# Patient Record
Sex: Male | Born: 1985
Health system: Southern US, Community
[De-identification: ages and names within clinical notes are randomized; demographics above are authoritative.]

## PROBLEM LIST (undated history)

## (undated) DIAGNOSIS — I471 Supraventricular tachycardia, unspecified: Secondary | ICD-10-CM

## (undated) DIAGNOSIS — K589 Irritable bowel syndrome without diarrhea: Secondary | ICD-10-CM

## (undated) HISTORY — PX: HYDROCELE EXCISION / REPAIR: SUR1145

---
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
## 2007-10-30 ENCOUNTER — Encounter: Admission: RE | Admit: 2007-10-30 | Discharge: 2007-10-30 | Payer: Self-pay | Admitting: Internal Medicine

## 2009-07-08 ENCOUNTER — Ambulatory Visit (HOSPITAL_COMMUNITY): Admission: RE | Admit: 2009-07-08 | Discharge: 2009-07-08 | Payer: Self-pay | Admitting: Internal Medicine

## 2012-05-15 ENCOUNTER — Ambulatory Visit (INDEPENDENT_AMBULATORY_CARE_PROVIDER_SITE_OTHER): Payer: BC Managed Care – PPO | Admitting: Family Medicine

## 2012-05-15 VITALS — BP 114/70 | HR 76 | Temp 97.8°F | Resp 16 | Ht 70.0 in | Wt 134.0 lb

## 2012-05-15 DIAGNOSIS — T63461A Toxic effect of venom of wasps, accidental (unintentional), initial encounter: Secondary | ICD-10-CM

## 2012-05-15 DIAGNOSIS — T6391XA Toxic effect of contact with unspecified venomous animal, accidental (unintentional), initial encounter: Secondary | ICD-10-CM

## 2012-05-15 MED ORDER — METHYLPREDNISOLONE ACETATE 80 MG/ML IJ SUSP
80.0000 mg | Freq: Once | INTRAMUSCULAR | Status: AC
  Administered 2012-05-15: 80 mg via INTRAMUSCULAR

## 2012-05-15 NOTE — Progress Notes (Signed)
Subjective: 26 year old male with history of getting several yellow jacket stings on his left arm when watering his mother garden. He has a burning discomfort and redness in his left arm into large patches between the elbow and shoulder.  Objective: Large splotches of erythema as noted above. One of the yellow jacket bites is visible.  Plan: Yellow jacket sting--this just appears to be a bad local reaction and not a true systemic allergy.  Plan: Depo-Medrol 80 Zyrtec

## 2012-05-15 NOTE — Patient Instructions (Addendum)
Zyrtec  Bee, Wasp, or Hornet Sting Your caregiver has diagnosed you as having an insect sting. An insect sting appears as a red lump in the skin that sometimes has a tiny hole in the center, or it may have a stinger in the center of the wound. The most common stings are from wasps, hornets and bees. Individuals have different reactions to insect stings.  A normal reaction may cause pain, swelling, and redness around the sting site.   A localized allergic reaction may cause swelling and redness that extends beyond the sting site.   A large local reaction may continue to develop over the next 12 to 36 hours.   On occasion, the reactions can be severe (anaphylactic reaction). An anaphylactic reaction may cause wheezing; difficulty breathing; chest pain; fainting; raised, itchy, red patches on the skin; a sick feeling to your stomach (nausea); vomiting; cramping; or diarrhea. If you have had an anaphylactic reaction to an insect sting in the past, you are more likely to have one again.  HOME CARE INSTRUCTIONS   With bee stings, a small sac of poison is left in the wound. Brushing across this with something such as a credit card, or anything similar, will help remove this and decrease the amount of the reaction. This same procedure will not help a wasp sting as they do not leave behind a stinger and poison sac.   Apply a cold compress for 10 to 20 minutes every hour for 1 to 2 days, depending on severity, to reduce swelling and itching.   To lessen pain, a paste made of water and baking soda may be rubbed on the bite or sting and left on for 5 minutes.   To relieve itching and swelling, you may use take medication or apply medicated creams or lotions as directed.   Only take over-the-counter or prescription medicines for pain, discomfort, or fever as directed by your caregiver.   Wash the sting site daily with soap and water. Apply antibiotic ointment on the sting site as directed.   If you  suffered a severe reaction:   If you did not require hospitalization, an adult will need to stay with you for 24 hours in case the symptoms return.   You may need to wear a medical bracelet or necklace stating the allergy.   You and your family need to learn when and how to use an anaphylaxis kit or epinephrine injection.   If you have had a severe reaction before, always carry your anaphylaxis kit with you.  SEEK MEDICAL CARE IF:   None of the above helps within 2 to 3 days.   The area becomes red, warm, tender, and swollen beyond the area of the bite or sting.   You have an oral temperature above 102 F (38.9 C).  SEEK IMMEDIATE MEDICAL CARE IF:  You have symptoms of an allergic reaction which are:  Wheezing.   Difficulty breathing.   Chest pain.   Lightheadedness or fainting.   Itchy, raised, red patches on the skin.   Nausea, vomiting, cramping or diarrhea.  ANY OF THESE SYMPTOMS MAY REPRESENT A SERIOUS PROBLEM THAT IS AN EMERGENCY. Do not wait to see if the symptoms will go away. Get medical help right away. Call your local emergency services (911 in [place] DO NOT drive yourself to the hospital. MAKE SURE YOU:   Understand these instructions.   Will watch your condition.   Will get help right away if you are not doing  well or get worse.  Document Released: 08/06/2005 Document Revised: 07/26/2011 Document Reviewed: 01/21/2010 [facility] Patient Information 2012 [place], [place].

## 2013-05-05 ENCOUNTER — Emergency Department (HOSPITAL_COMMUNITY)
Admission: EM | Admit: 2013-05-05 | Discharge: 2013-05-05 | Disposition: A | Discharge status: Home / Self Care | Payer: BC Managed Care – PPO | Attending: Emergency Medicine | Admitting: Emergency Medicine

## 2013-05-05 ENCOUNTER — Emergency Department (HOSPITAL_COMMUNITY): Payer: BC Managed Care – PPO

## 2013-05-05 ENCOUNTER — Encounter (HOSPITAL_COMMUNITY): Payer: Self-pay | Admitting: Family Medicine

## 2013-05-05 DIAGNOSIS — R0789 Other chest pain: Secondary | ICD-10-CM | POA: Insufficient documentation

## 2013-05-05 DIAGNOSIS — R0902 Hypoxemia: Secondary | ICD-10-CM | POA: Insufficient documentation

## 2013-05-05 DIAGNOSIS — R000 Tachycardia, unspecified: Secondary | ICD-10-CM | POA: Insufficient documentation

## 2013-05-05 DIAGNOSIS — R0602 Shortness of breath: Secondary | ICD-10-CM

## 2013-05-05 DIAGNOSIS — R079 Chest pain, unspecified: Secondary | ICD-10-CM | POA: Insufficient documentation

## 2013-05-05 DIAGNOSIS — R030 Elevated blood-pressure reading, without diagnosis of hypertension: Secondary | ICD-10-CM | POA: Insufficient documentation

## 2013-05-05 LAB — POCT I-STAT, CHEM 8
BUN: 5 mg/dL — ABNORMAL LOW (ref 6–23)
Chloride: 106 mEq/L (ref 96–112)
Creatinine, Ser: 1.3 mg/dL (ref 0.50–1.35)
Potassium: 3.7 mEq/L (ref 3.5–5.1)
Sodium: 147 mEq/L — ABNORMAL HIGH (ref 135–145)

## 2013-05-05 LAB — D-DIMER, QUANTITATIVE: D-Dimer, Quant: <0.27 ug/mL-FEU (ref 0.00–0.48)

## 2013-05-05 NOTE — ED Notes (Signed)
Pt able to ambulate without any difficulties or complaints of SOB.

## 2013-05-05 NOTE — ED Provider Notes (Signed)
CSN: [id number]     Arrival date & time 05/05/13  0110 History   First MD Initiated Contact with Patient 05/05/13 [phone]     Chief Complaint  Patient presents with  . Shortness of Breath   (Consider location/radiation/quality/duration/timing/severity/associated sxs/prior Treatment) HPI History provided by pt.   Pt laid down to go to bed at 11:45 and had acute onset SOB and chest tightness.  Went to [employer] where his vital signs were checked and he was found to be hypertensive, tachycardic and hypoxic w/ O2 sat in upper 80s-92.  Dyspnea resolved spontaneously in 2-2.5hrs.  No recent fever, cough, nasal congestion, rhinorrhea, abd pain, N/V/D, LE edema/pain.  No recent trauma. No h/o cardiac or pulmonary dz.  No recent trauma.  Does not smoke cigarettes.  No RF for PE. History reviewed. No pertinent past medical history. Past Surgical History  Procedure Laterality Date  . Hydrocele excision / repair     No family history on file. History  Substance Use Topics  . Smoking status: Never Smoker   . Smokeless tobacco: Not on file  . Alcohol Use: Yes     Comment: Socially    Review of Systems  All other systems reviewed and are negative.    Allergies  Review of patient's allergies indicates no known allergies.  Home Medications   Current Outpatient Rx  Name  Route  Sig  Dispense  Refill  . clidinium-chlordiazePOXIDE (LIBRAX) 2.5-5 MG per capsule   Oral   Take 1 capsule by mouth 3 (three) times daily as needed.          BP 130/86  Pulse 96  Temp(Src) 98.5 F (36.9 C) (Oral)  Resp 16  Wt 130 lb (58.968 kg)  BMI 18.65 kg/m2  SpO2 99% Physical Exam  Nursing note and vitals reviewed. Constitutional: He is oriented to person, place, and time. He appears well-developed and well-nourished. No distress.  HENT:  Head: Normocephalic and atraumatic.  Eyes:  Normal appearance  Neck: Normal range of motion.  Cardiovascular: Normal rate, regular rhythm and intact distal pulses.    Pulmonary/Chest: Effort normal and breath sounds normal. No respiratory distress.  No pleuritic pain reported  Abdominal: Soft. Bowel sounds are normal. He exhibits no distension. There is no tenderness. There is no guarding.  Musculoskeletal: Normal range of motion.  No peripheral edema or calf tenderness  Neurological: He is alert and oriented to person, place, and time.  Skin: Skin is warm and dry. No rash noted.  Psychiatric: He has a normal mood and affect. His behavior is normal.    ED Course  Procedures (including critical care time)  Date: 05/05/2013  Rate: 107  Rhythm: sinus tachycardia  QRS Axis: normal  Intervals: normal  ST/T Wave abnormalities: normal  Conduction Disutrbances:none  Narrative Interpretation:   Old EKG Reviewed: none available   Labs Review Labs Reviewed  POCT I-STAT, CHEM 8 - Abnormal; Notable for the following:    Sodium 147 (*)    BUN 5 (*)    All other components within normal limits  D-DIMER, QUANTITATIVE   Imaging Review Dg Chest 2 View  05/05/2013   *RADIOLOGY REPORT*  Clinical Data: Shortness of breath and hypoxia  CHEST - 2 VIEW  Comparison:  None available.  Findings: Cardiac and mediastinal silhouettes are within normal limits.  Lungs are well inflated.  No airspace consolidation, pleural effusion, or pulmonary edema is identified.  There is no pneumothorax.  No acute osseous abnormality identified.  IMPRESSION: No  acute cardiopulmonary process.   Original Report Authenticated By: Rise Mu, M.D.    MDM   1. 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  Resolved spontaneously w/in 2 hours and now feels well.  No trauma.  No h/o cardiac/pulmonary dz.  No RF for PE.  On exam, afebrile, elevated HR in 90s, no respiratory distress, nml breath sounds, no LE edema/ttp.  EKG and CXR unremarkable and d-dimer neg.  Results discussed w/ pt.  Nursing  staff ambulated and he did not drop his O2 sat nor become symptomatic.  Suspect he may have had an anxiety attack. Return precautions discussed.     Otilio Miu, PA-C 05/05/13 [phone]

## 2013-05-05 NOTE — ED Notes (Signed)
Patient states that he went to work and then went out after work. States that when he got into bed he has been unable to catch his breath and has been feeling short of breath. 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

## 2013-05-06 NOTE — ED Provider Notes (Signed)
Medical screening examination/treatment/procedure(s) were performed by non-physician practitioner and as supervising physician I was immediately available for consultation/collaboration.  Aftan Vint R. Thinh Cuccaro, MD 05/06/13 2025 

## 2019-08-11 MED FILL — VIBERZI 100 MG TABS: 100 | 30 days supply | Qty: 30 | Fill #0

## 2019-09-10 DIAGNOSIS — M25311 Other instability, right shoulder: Secondary | ICD-10-CM | POA: Diagnosis not present

## 2019-09-10 DIAGNOSIS — M25511 Pain in right shoulder: Secondary | ICD-10-CM | POA: Diagnosis not present

## 2019-09-10 MED FILL — VIBERZI 100 MG TABS: 100 | 90 days supply | Qty: 90 | Fill #0

## 2019-10-15 ENCOUNTER — Ambulatory Visit: Payer: 59 | Attending: Internal Medicine

## 2019-10-15 DIAGNOSIS — Z20822 Contact with and (suspected) exposure to covid-19: Secondary | ICD-10-CM | POA: Insufficient documentation

## 2019-10-16 LAB — NOVEL CORONAVIRUS, NAA: SARS-CoV-2, NAA: NOT DETECTED

## 2019-12-08 MED FILL — VIBERZI 100 MG TABS: 100 | 90 days supply | Qty: 90 | Fill #1

## 2020-03-07 MED FILL — VIBERZI 100 MG TABS: 100 | 90 days supply | Qty: 90 | Fill #0

## 2020-04-13 ENCOUNTER — Emergency Department (HOSPITAL_COMMUNITY)
Admission: EM | Admit: 2020-04-13 | Discharge: 2020-04-13 | Disposition: A | Discharge status: Home / Self Care | Payer: 59 | Attending: Emergency Medicine | Admitting: Emergency Medicine

## 2020-04-13 ENCOUNTER — Emergency Department (HOSPITAL_COMMUNITY): Payer: 59

## 2020-04-13 ENCOUNTER — Encounter (HOSPITAL_COMMUNITY): Payer: Self-pay | Admitting: Emergency Medicine

## 2020-04-13 DIAGNOSIS — R200 Anesthesia of skin: Secondary | ICD-10-CM | POA: Diagnosis present

## 2020-04-13 DIAGNOSIS — G43909 Migraine, unspecified, not intractable, without status migrainosus: Secondary | ICD-10-CM | POA: Diagnosis not present

## 2020-04-13 DIAGNOSIS — G43109 Migraine with aura, not intractable, without status migrainosus: Secondary | ICD-10-CM

## 2020-04-13 LAB — COMPREHENSIVE METABOLIC PANEL
ALT: 29 U/L (ref 0–44)
AST: 23 U/L (ref 15–41)
Albumin: 4.6 g/dL (ref 3.5–5.0)
Alkaline Phosphatase: 49 U/L (ref 38–126)
Anion gap: 12 (ref 5–15)
BUN: 13 mg/dL (ref 6–20)
CO2: 23 mmol/L (ref 22–32)
Calcium: 9.6 mg/dL (ref 8.9–10.3)
Chloride: 103 mmol/L (ref 98–111)
Creatinine, Ser: 0.97 mg/dL (ref 0.61–1.24)
GFR calc Af Amer: >60 mL/min (ref >60)
GFR calc non Af Amer: >60 mL/min (ref >60)
Glucose, Bld: 105 mg/dL — ABNORMAL HIGH (ref 70–99)
Potassium: 3.7 mmol/L (ref 3.5–5.1)
Sodium: 138 mmol/L (ref 135–145)
Total Bilirubin: 0.9 mg/dL (ref 0.3–1.2)
Total Protein: 7.3 g/dL (ref 6.5–8.1)

## 2020-04-13 LAB — PROTIME-INR
INR: 1 (ref 0.8–1.2)
Prothrombin Time: 12.6 seconds (ref 11.4–15.2)

## 2020-04-13 LAB — DIFFERENTIAL
Abs Immature Granulocytes: 0 10*3/uL (ref 0.00–0.07)
Basophils Absolute: 0 10*3/uL (ref 0.0–0.1)
Basophils Relative: 0 %
Eosinophils Absolute: 0.1 10*3/uL (ref 0.0–0.5)
Eosinophils Relative: 1 %
Immature Granulocytes: 0 %
Lymphocytes Relative: 36 %
Lymphs Abs: 1.7 10*3/uL (ref 0.7–4.0)
Monocytes Absolute: 0.5 10*3/uL (ref 0.1–1.0)
Monocytes Relative: 10 %
Neutro Abs: 2.6 10*3/uL (ref 1.7–7.7)
Neutrophils Relative %: 53 %

## 2020-04-13 LAB — CBC
HCT: 47.1 % (ref 39.0–52.0)
Hemoglobin: 15.8 g/dL (ref 13.0–17.0)
MCH: 30.3 pg (ref 26.0–34.0)
MCHC: 33.5 g/dL (ref 30.0–36.0)
MCV: 90.2 fL (ref 80.0–100.0)
Platelets: 227 10*3/uL (ref 150–400)
RBC: 5.22 MIL/uL (ref 4.22–5.81)
RDW: 11.9 % (ref 11.5–15.5)
WBC: 4.8 10*3/uL (ref 4.0–10.5)
nRBC: 0 % (ref 0.0–0.2)

## 2020-04-13 LAB — APTT: aPTT: 29 seconds (ref 24–36)

## 2020-04-13 LAB — I-STAT CHEM 8, ED
BUN: 14 mg/dL (ref 6–20)
Calcium, Ion: 1.14 mmol/L — ABNORMAL LOW (ref 1.15–1.40)
Chloride: 103 mmol/L (ref 98–111)
Creatinine, Ser: 0.9 mg/dL (ref 0.61–1.24)
Glucose, Bld: 100 mg/dL — ABNORMAL HIGH (ref 70–99)
HCT: 48 % (ref 39.0–52.0)
Hemoglobin: 16.3 g/dL (ref 13.0–17.0)
Potassium: 3.9 mmol/L (ref 3.5–5.1)
Sodium: 140 mmol/L (ref 135–145)
TCO2: 24 mmol/L (ref 22–32)

## 2020-04-13 LAB — CBG MONITORING, ED: Glucose-Capillary: 98 mg/dL (ref 70–99)

## 2020-04-13 MED ORDER — DEXAMETHASONE SODIUM PHOSPHATE 10 MG/ML IJ SOLN
10.0000 mg | Freq: Once | INTRAMUSCULAR | Status: AC
  Administered 2020-04-13: 10 mg via INTRAVENOUS
  Filled 2020-04-13: qty 1

## 2020-04-13 MED ORDER — SODIUM CHLORIDE 0.9 % IV BOLUS
1000.0000 mL | Freq: Once | INTRAVENOUS | Status: AC
  Administered 2020-04-13: 1000 mL via INTRAVENOUS

## 2020-04-13 MED ORDER — METOCLOPRAMIDE HCL 10 MG PO TABS: 10.0000 mg | ORAL_TABLET | Freq: Three times a day (TID) | ORAL | 0 refills | Status: AC | PRN

## 2020-04-13 MED ORDER — METOCLOPRAMIDE HCL 5 MG/ML IJ SOLN
10.0000 mg | Freq: Once | INTRAMUSCULAR | Status: AC
  Administered 2020-04-13: 10 mg via INTRAVENOUS
  Filled 2020-04-13: qty 2

## 2020-04-13 MED ORDER — DIPHENHYDRAMINE HCL 25 MG PO CAPS
25.0000 mg | ORAL_CAPSULE | Freq: Once | ORAL | Status: AC
  Administered 2020-04-13: 25 mg via ORAL
  Filled 2020-04-13: qty 1

## 2020-04-13 MED ORDER — SODIUM CHLORIDE 0.9% FLUSH
3.0000 mL | Freq: Once | INTRAVENOUS | Status: DC
Start: 2020-04-13 — End: 2020-04-13

## 2020-04-13 MED ORDER — KETOROLAC TROMETHAMINE 15 MG/ML IJ SOLN
15.0000 mg | Freq: Once | INTRAMUSCULAR | Status: AC
  Administered 2020-04-13: 15 mg via INTRAVENOUS
  Filled 2020-04-13: qty 1

## 2020-04-13 NOTE — Discharge Instructions (Signed)
If you have recurrence of headache or migraine symptoms, take ibuprofen. You may also take Reglan as needed. You may get agitated with this, as such take Benadryl. Make sure you stay will hydrated with water. Follow-up with neurology listed below. Return to the emergency room if you develop any new, sudden, or concerning symptoms.

## 2020-04-13 NOTE — Consult Note (Signed)
Stroke Neurology Consultation Note  Consult Requested by: Dr. Hyacinth Meeker  Reason for Consult: code stroke  Consult Date: 04/13/20   The history was obtained from the pt.  During history and examination, all items were able to obtain unless otherwise noted.  History of Present Illness:  Joshua Harrison is a 34 y.o. Caucasian male with PMH of headache, head concussion in the past presented to ED for HA, blurry vision and right sided numbness.   Pt stated that he was on computer this am 9:30am and had right eye blurry vision and not able to see the computer and also has HA at left posterior of head. He took Excedrin and felt better but at 11:30am he had right tongue numbness, right jaw and right arm numbness. He also felt some heaviness of left arm too. Denies speech difficulty, left sided symptoms. He came to ER for evaluation. Currently right eye blurry vision getting better as well as HA but numbness still there.  He stated that he was hit on head during football game in college in 2010 and apparently had concussion. He can not remember in detail but said that days after he was in doctors office and passed out one time, ? seizure. MRI in 06/2009 showed normal. He has HA once a months for several years and takes Excedrin and HA lasts for 2 hours and gone. No similar symptoms in the past.   LSN: 9:30am tPA Given: No: not stroke.   History reviewed. No pertinent past medical history.  Past Surgical History:  Procedure Laterality Date  . HYDROCELE EXCISION / REPAIR      No family history on file.  Social History:  reports that he has never smoked. He does not have any smokeless tobacco history on file. He reports current alcohol use. He reports that he does not use drugs.  Allergies: No Known Allergies  No current facility-administered medications on file prior to encounter.   Current Outpatient Medications on File Prior to Encounter  Medication Sig Dispense Refill  .  clidinium-chlordiazePOXIDE (LIBRAX) 2.5-5 MG per capsule Take 1 capsule by mouth 3 (three) times daily as needed.      Review of Systems: A full ROS was attempted today and was able to be performed.  Systems assessed include - Constitutional, Eyes, HENT, Respiratory, Cardiovascular, Gastrointestinal, Genitourinary, Integument/breast, Hematologic/lymphatic, Musculoskeletal, Neurological, Behavioral/Psych, Endocrine, Allergic/Immunologic - with pertinent responses as per HPI.  Physical Examination:    General - well nourished, well developed, in no apparent distress.    Ophthalmologic - fundi not visualized due to noncooperation.    Cardiovascular - regular rhythm and rate  Mental Status -  Level of arousal and orientation to time, place, and person were intact. Language including expression, naming, repetition, comprehension, reading, and writing was assessed and found intact. Fund of Knowledge was assessed and was intact.  Cranial Nerves II - XII - II - Vision intact OS. Still mildly decreased visual acuity on the OD. III, IV, VI - Extraocular movements intact. V - Facial sensation decreased on the right, about 25% of the left. VII - Facial movement intact bilaterally. VIII - Hearing & vestibular intact bilaterally. X - Palate elevates symmetrically. XI - Chin turning & shoulder shrug intact bilaterally. XII - Tongue protrusion intact.  Motor Strength - The patient's strength was normal in all extremities and pronator drift was absent.   Motor Tone & Bulk - Muscle tone was assessed at the neck and appendages and was normal.  Bulk was normal  and fasciculations were absent.   Reflexes - The patient's reflexes were normal in all extremities and he had no pathological reflexes.  Sensory - Light touch, temperature/pinprick were assessed and were decreased on the right, about 25% of the left.    Coordination - The patient had normal movements in the hands with no ataxia or dysmetria.   Tremor was absent.  Gait and Station - deferred  Data Reviewed: No results found.  Assessment: 34 y.o. male with hx of head concussion and HA presented with blurry vision, HA and right sided numbness. Exam showed right facial and arm decreased sensation and right visual acuity decreased. NIHSS = 1. CT no acute abnormality. And MRI no infarct. Pt presentation most consistent with complicated migraine. Pt has hx of HA, no risk factors for stroke, do not feel further acute stroke work up needed at this time. Will treat HA symptomatically. If he improves and able to ambulate in the hallway, he can be discharged from ER from neuro standpoint. He needs close follow up with neurology as outpt. Will set up for GNA.   Plan: - do not feel further acute stroke work up needed at this time.  - Will treat HA symptomatically. - If he improves and able to ambulate in the hallway, he can be discharged from ER from neuro standpoint.  - Will set up for GNA follow up in one week.  Thank you for this consultation and allowing [place] to participate in the care of this patient.  Marvel Plan, MD PhD Stroke Neurology 04/13/2020 1:31 PM

## 2020-04-13 NOTE — ED Notes (Signed)
ACTIVATED CODE STROKE 

## 2020-04-13 NOTE — Code Documentation (Addendum)
Stroke Response Nurse Documentation Code Documentation  Joshua Harrison is a 34 y.o. male arriving to [facility] ED via Private Vehicle on 04/13/2020 with past medical hx of migraines and flag football head injury in college. Code stroke was activated by ED. Patient was LKW at 0900 when he was at home working. He reported that he had a sudden onset of blurred vision, trouble reading, headache, and right sided sensory decreased. Stroke team at the bedside on patient arrival. Patient cleared for CT by Dr. Madilyn Hook. Patient to CT with Nurse First. NIHSS 1, see documentation for details and code stroke times. Patient with right decreased sensation on exam. The following imaging was completed: CT. Decision made to proceed with MRI. Arrived in MRI at 1230. Patient is not a tPA candidate due to no stroke on MRI per Dr. Roda Shutters.  Care/Plan: Code Stroke cancelled. Patient to be watched in the ED. Bedside handoff with ED RN Wyn Forster.    Lucila Maine  Stroke Response RN

## 2020-04-13 NOTE — ED Provider Notes (Signed)
[facility] EMERGENCY DEPARTMENT Provider Note   CSN: [id number] Arrival date & time: 04/13/20  1153  An emergency department physician performed an initial assessment on this suspected stroke patient at 1201.  History Chief Complaint  Patient presents with  . Numbness  . Headache    Joshua Harrison is a 34 y.o. male presenting for evaluation of headache, right tongue and mouth numbness, and right arm numbness.  Patient states he developed a headache around 930 this morning.  He reports a history of migraines, states this was more severe.  30 minutes prior to arrival he developed numbness of the right side of his face, tongue, and arm. He has associated nausea.  Code stroke was called in triage prior to my evaluation.  On my evaluation, patient reports symptoms are improving, though not completely resolved.  Reports continued headache.  He denies recent trauma or injury to the head.  He denies recent fevers, chills, chest pain, shortness breath, cough, vomiting, abd pain, urinary symptoms, abnormal bowel movements. He reports no medical problems, takes no medications daily. He denies tobacco or drug use. Intermittent etoh use.   HPI     History reviewed. No pertinent past medical history.  There are no problems to display for this patient.   Past Surgical History:  Procedure Laterality Date  . HYDROCELE EXCISION / REPAIR         No family history on file.  Social History   Tobacco Use  . Smoking status: Never Smoker  Substance Use Topics  . Alcohol use: Yes    Comment: Socially  . Drug use: No    Home Medications Prior to Admission medications   Medication Sig Start Date End Date Taking? Authorizing Provider  clidinium-chlordiazePOXIDE (LIBRAX) 2.5-5 MG per capsule Take 1 capsule by mouth 3 (three) times daily as needed.    [provider]  VIBERZI 100 MG TABS Take 100 mg by mouth daily. 03/07/20   [provider]     Allergies    Patient has no known allergies.  Review of Systems   Review of Systems  Gastrointestinal: Positive for nausea.  Neurological: Positive for numbness and headaches.  All other systems reviewed and are negative.   Physical Exam Updated Vital Signs BP 131/77   Pulse 74   Temp 98.1 F (36.7 C) (Temporal)   Resp (!) 9   SpO2 99%   Physical Exam Vitals and nursing note reviewed.  Constitutional:      General: He is not in acute distress.    Appearance: He is well-developed.     Comments: Resting in the bed in NAD  HENT:     Head: Normocephalic and atraumatic.  Eyes:     Extraocular Movements: Extraocular movements intact.     Conjunctiva/sclera: Conjunctivae normal.     Pupils: Pupils are equal, round, and reactive to light.  Cardiovascular:     Rate and Rhythm: Normal rate and regular rhythm.     Pulses: Normal pulses.  Pulmonary:     Effort: Pulmonary effort is normal. No respiratory distress.     Breath sounds: Normal breath sounds. No wheezing.  Abdominal:     General: There is no distension.     Palpations: Abdomen is soft. There is no mass.     Tenderness: There is no abdominal tenderness. There is no guarding or rebound.  Musculoskeletal:        General: Normal range of motion.     Cervical back: Normal  range of motion and neck supple.  Skin:    General: Skin is warm and dry.     Capillary Refill: Capillary refill takes less than 2 seconds.  Neurological:     General: No focal deficit present.     Mental Status: He is alert and oriented to person, place, and time.     GCS: GCS eye subscore is 4. GCS verbal subscore is 5. GCS motor subscore is 6.     Cranial Nerves: Cranial nerves are intact.     Sensory: Sensation is intact.     Motor: Motor function is intact.     Comments: Grip strength equal bilaterally. Negative pronator drift.  Sensation of upper extremities bilaterally.  CN intact.     ED Results / Procedures / Treatments    Labs (all labs ordered are listed, but only abnormal results are displayed) Labs Reviewed  COMPREHENSIVE METABOLIC PANEL - Abnormal; Notable for the following components:      Result Value   Glucose, Bld 105 (*)    All other components within normal limits  I-STAT CHEM 8, ED - Abnormal; Notable for the following components:   Glucose, Bld 100 (*)    Calcium, Ion 1.14 (*)    All other components within normal limits  SARS CORONAVIRUS 2 BY RT PCR (HOSPITAL ORDER, PERFORMED IN  HOSPITAL LAB)  PROTIME-INR  APTT  CBC  DIFFERENTIAL  CBG MONITORING, ED    EKG EKG Interpretation  Date/Time:  Wednesday April 13 2020 13:15:16 EDT Ventricular Rate:  80 PR Interval:    QRS Duration: 87 QT Interval:  368 QTC Calculation: 425 R Axis:   94 Text Interpretation: Sinus rhythm Prolonged PR interval Probable lateral infarct, old Confirmed by Eber Hong ([id number]) on 04/13/2020 1:19:02 PM   Radiology MR BRAIN WO CONTRAST  Result Date: 04/13/2020 CLINICAL DATA:  Neuro deficit. Acute stroke versus complicated migraine. EXAM: MRI HEAD WITHOUT CONTRAST TECHNIQUE: Multiplanar, multiecho pulse sequences of the brain and surrounding structures were obtained without intravenous contrast. COMPARISON:  CT from the same day. FINDINGS: Brain: No restricted diffusi no mass lesion or abnormal mass on. No substantial white matter signal abnormality. No hydrocephalus. No acute hemorrhage. Effect. Vascular: Normal flow voids. Skull and upper cervical spine: Normal marrow signal. Sinuses/Orbits: Mild inferior maxillary sinus mucosal thickening. No air-fluid levels. Normal orbits. Other: No mastoid effusion. IMPRESSION: No acute intracranial abnormality.  Specifically, no acute infarct. Electronically Signed   By: Feliberto Harts MD   On: 04/13/2020 13:17   CT HEAD CODE STROKE WO CONTRAST  Result Date: 04/13/2020 CLINICAL DATA:  Code stroke. Neuro deficit, acute, stroke suspected. EXAM: CT HEAD WITHOUT  CONTRAST TECHNIQUE: Contiguous axial images were obtained from the base of the skull through the vertex without intravenous contrast. COMPARISON:  MRI head 07/08/2009. FINDINGS: Brain: No acute hemorrhage. Apparent loss of gray-white differentiation in the left occipital lobe (for example series 3, image 16). No other areas of gray-white differentiation loss identified. No mass lesion. No mass effect. No hydrocephalus. Vascular: No definite hyperdense vessel or unexpected calcification. Skull: Normal. Negative for fracture or focal lesion. Sinuses/Orbits: Mild mucosal thickening of the inferior left maxillary sinus. ASPECTS (Alberta Stroke Program Early CT Score): 10 IMPRESSION: 1. Apparent loss of gray-white differentiation in the left occipital lobe (left PCA territory) is concerning for acute infarct, although this may be artifactual. If there is concern for acute infarct, recommend MRI to further evaluate. 2. No acute hemorrhage. Code stroke imaging results were communicated on  04/13/2020 at 12:37 pm to provider Roda Shutters via telephone, who verbally acknowledged these results. Electronically Signed   By: Feliberto Harts MD   On: 04/13/2020 12:44    Procedures Procedures (including critical care time)  Medications Ordered in ED Medications  sodium chloride flush (NS) 0.9 % injection 3 mL (3 mLs Intravenous Not Given 04/13/20 1424)  ketorolac (TORADOL) 15 MG/ML injection 15 mg (15 mg Intravenous Given 04/13/20 1355)  metoCLOPramide (REGLAN) injection 10 mg (10 mg Intravenous Given 04/13/20 1355)  dexamethasone (DECADRON) injection 10 mg (10 mg Intravenous Given 04/13/20 1355)  diphenhydrAMINE (BENADRYL) capsule 25 mg (25 mg Oral Given 04/13/20 1348)  sodium chloride 0.9 % bolus 1,000 mL (1,000 mLs Intravenous Bolus from Bag 04/13/20 1400)    ED Course  I have reviewed the triage vital signs and the nursing notes.  Pertinent labs & imaging results that were available during my care of the patient were  reviewed by me and considered in my medical decision making (see chart for details).    MDM Rules/Calculators/A&P                          Patient presenting for evaluation of headache and right-sided numbness.  On exam, patient reports symptoms are improving, but not completely resolved.  Upon my evaluation, patient had already been evaluated by neurology (Dr. Otelia Limes), had a CT head and an MRI, both of which were negative for acute findings.  Labs obtained in triage interpreted by me, overall reassuring.  Electrolytes stable.  Hemoglobin stable.  No acute embolic abnormality that would explain symptoms.  As such, likely complicated migraine.  Will treat with headache cocktail and reassess.  On reassessment, patient reports improvement of symptoms.  I discussed continued treatment for migraine as needed, follow with outpatient neurology.  At this time, patient appears safe for discharge.  Return precautions given.  Patient states he understands and agrees to plan.  Final Clinical Impression(s) / ED Diagnoses Final diagnoses:  Complicated migraine    Rx / DC Orders ED Discharge Orders         Ordered    Ambulatory referral to Neurology       Comments: Hospital follow up likely complicated migraine, needs one week urgent follow up. Thanks.   04/13/20 1335           Alveria Apley, PA-C 04/13/20 1532    Eber Hong, MD 04/15/20 [phone]

## 2020-04-13 NOTE — ED Notes (Signed)
Pt taken to CT by this RN.  ?

## 2020-04-13 NOTE — ED Notes (Signed)
Patient evaluated by Dr. Madilyn Hook at ED bridge.

## 2020-04-13 NOTE — ED Triage Notes (Addendum)
Pt reports started having a migraine with R sided blurred vision while at work, at 930 am this morning, reports that approx 30 mins ago the R side of his tongue and R arm began feeling numb. Speech clear, a/ox4, face symmetrical. Move all limbs equally. Slight drift to R arm upon neuro exam. Hx of 1 other migraine in the past.

## 2020-06-06 MED FILL — VIBERZI 100 MG TABS: 100 | 90 days supply | Qty: 90 | Fill #1

## 2020-09-06 ENCOUNTER — Other Ambulatory Visit (HOSPITAL_COMMUNITY): Payer: Self-pay | Admitting: Gastroenterology

## 2020-09-19 ENCOUNTER — Encounter: Payer: Self-pay | Admitting: Neurology

## 2020-09-19 ENCOUNTER — Ambulatory Visit: Payer: 59 | Admitting: Neurology

## 2020-09-19 VITALS — BP 129/77 | HR 89 | Ht 71.0 in | Wt 155.0 lb

## 2020-09-19 DIAGNOSIS — F419 Anxiety disorder, unspecified: Secondary | ICD-10-CM | POA: Insufficient documentation

## 2020-09-19 DIAGNOSIS — R000 Tachycardia, unspecified: Secondary | ICD-10-CM | POA: Diagnosis not present

## 2020-09-19 DIAGNOSIS — G43109 Migraine with aura, not intractable, without status migrainosus: Secondary | ICD-10-CM | POA: Diagnosis not present

## 2020-09-19 DIAGNOSIS — G4700 Insomnia, unspecified: Secondary | ICD-10-CM

## 2020-09-19 DIAGNOSIS — K580 Irritable bowel syndrome with diarrhea: Secondary | ICD-10-CM | POA: Diagnosis not present

## 2020-09-19 DIAGNOSIS — R0683 Snoring: Secondary | ICD-10-CM | POA: Insufficient documentation

## 2020-09-19 MED ORDER — UBRELVY 50 MG PO TABS
ORAL_TABLET | ORAL | 0 refills | Status: AC
Start: 2020-09-19 — End: ?

## 2020-09-19 MED ORDER — TRAZODONE HCL 50 MG PO TABS: 50.0000 mg | ORAL_TABLET | Freq: Every day | ORAL | 5 refills | Status: AC

## 2020-09-19 NOTE — Progress Notes (Signed)
SLEEP MEDICINE CLINIC    Provider:  Melvyn Novas, MD  Primary Care Physician:  Marden Noble, MD [address] [place] [address]     Referring Provider: Marvel Plan, Md [address] [place],  [place] [address]          Chief Complaint according to patient   Patient presents with:    . New Patient (Initial Visit)           HISTORY OF PRESENT ILLNESS:  Joshua Harrison is a 35 y.o. Caucasian male patient and [employer] employee and is seen here upon release from the STROKE service on  04-09-2020- , seen  on 09/19/2020 .  Chief concern  :   Pt alone, rm 10. Pt in aug states that he was working and started having problems with concentrating on computer screen/trouble with looking at computer. He did note to have a slight headache (which no hx of migraines or HA's) he tried taking a nap to rest and after 30 min woke up had right fingers tingling/rt side of tongue was numb and rt leg weakness. ER c/o MRI, CT Blood work and worked up as complicated migraine.      Other He states had been ok until last tues/wed, started developing some of same feeling ith rt side numbness in tongue/cheek. Prior to these two incidents he has never had hx of migraines or HA's. He did have a head injury in college, 2010 which he was in ER for. Also notes that in last 24 hrs his heart rate has been abnormally elevated which is unusual for him.        I have the pleasure of seeing Joshua Harrison today, a right -handed Caucasian male with a possible complicated migraine. He  has no past medical history on file. He never had migraines, but the more common headaches. In August 2021, he had a vision impairment. It passed within 5 hours, negative CT and MRI brain were optained. I reviewed these with him.    Last week he has had numbness in face, tongue and right sided extremities- but this time  no headaches, no vision impairment. He did not go to the ED this time.  He reports  sleeping rather poorly, after 5 mg of melatonin, sleeps only until 3 AM and then continues to be restless. He had a weird numbness, and it lasted 36 hours, he woke up with the symptoms having resolved .    Family medical /sleep history: No history of seizures, migraines , stroke.  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 Family status is married.  Pets are present, a fog and a cat.  Tobacco use; none .  ETOH use ; seldomly, Caffeine intake in form of Coffee(2 cups in AM  Soda( 1 coke in PM ) Tea (/) or energy drinks. Regular exercise: [place]. .    Sleep habits are as follows: The patient's dinner time is between 6.45 PM. The patient goes to bed at 9.30 PM and continues to sleep for 4-5 hours, wakes - not for bathroom break- and sleeps poorly after that, mind is racing. He has palpitations.  The preferred sleep position is on his side , with the support of 2 pillows. Dreams are reportedly frequent/vivid.  6 AM is the usual rise time. The patient wakes up  spontaneously.  He reports not feeling refreshed or restored in AM, Naps are taken infrequently,- took one yesterday lasting 45 minutes and are morerefreshing than nocturnal sleep.  Snores himself sometimes awake, when on his left side.    Review of Systems: Out of a complete 14 system review, the patient complains of only the following symptoms, and all other reviewed systems are negative.:  Fatigue, witnessed snoring, fragmented sleep, second half of the night he has Insomnia    How likely are you to doze in the following situations: 0 = not likely, 1 = slight chance, 2 = moderate chance, 3 = high chance   Sitting and Reading? Watching Television? Sitting inactive in a public place (theater or meeting)? As a passenger in a car for an hour without a break? Lying down in the afternoon when circumstances permit? Sitting and talking to  someone? Sitting quietly after lunch without alcohol? In a car, while stopped for a few minutes in traffic?   Total =N?A / 24 points   FSS endorsed at N/A / 63 points.   Social History   Socioeconomic History  . Marital status: Single    Spouse name: Not on file  . Number of children: Not on file  . Years of education: Not on file  . Highest education level: Not on file  Occupational History  . Not on file  Tobacco Use  . Smoking status: Never Smoker  . Smokeless tobacco: Never Used  Substance and Sexual Activity  . Alcohol use: Yes    Comment: Socially  . Drug use: No  . Sexual activity: Yes  Other Topics Concern  . Not on file  Social History Narrative  . Not on file   Social Determinants of Health   Financial Resource Strain: Not on file  Food Insecurity: Not on file  Transportation Needs: Not on file  Physical Activity: Not on file  Stress: Not on file  Social Connections: Not on file     Past Surgical History:  Procedure Laterality Date  . HYDROCELE EXCISION / REPAIR       Current Outpatient Medications on File Prior to Visit  Medication Sig Dispense Refill  . metoCLOPramide (REGLAN) 10 MG tablet Take 1 tablet (10 mg total) by mouth every 8 (eight) hours as needed for nausea. 30 tablet 0  . VIBERZI 100 MG TABS Take 100 mg by mouth daily.     No current facility-administered medications on file prior to visit.    No Known Allergies  Physical exam:  Today's Vitals   09/19/20 0846  BP: 129/77  Pulse: 89  Weight: 155 lb (70.3 kg)  Height: 5\' 11"  (1.803 m)   Body mass index is 21.62 kg/m.   Wt Readings from Last 3 Encounters:  09/19/20 155 lb (70.3 kg)  05/05/13 130 lb (59 kg)  05/15/12 134 lb (60.8 kg)     Ht Readings from Last 3 Encounters:  09/19/20 5\' 11"  (1.803 m)  05/15/12 5\' 10"  (1.778 m)      General: The patient is awake, alert and appears not in acute distress. The patient is well groomed. Head: Normocephalic, atraumatic. Neck  is supple. Mallampati ,  neck circumference:16.0 inches . Nasal airflow patent.  Retrognathia is not seen.  Dental status:  Cardiovascular:  Regular rate and cardiac rhythm by pulse,  without distended neck veins. Respiratory: Lungs are clear to auscultation.  Skin:  Without evidence of ankle edema, or rash. Trunk: The patient's posture is erect.  Neurologic exam : The patient is awake and alert, oriented to place and time.   Memory subjective described as intact.  Attention span & concentration ability appears normal.  Speech is fluent,  without  dysarthria, dysphonia or aphasia.  Mood and affect are appropriate.   Cranial nerves: no loss of smell or taste reported  Pupils are equal and briskly reactive to light. Funduscopic exam deferred..  Extraocular movements in vertical and horizontal planes were intact and without nystagmus. No Diplopia. Visual fields by finger perimetry are intact. Hearing was intact to soft voice and finger rubbing.    Facial sensation intact to fine touch.  Facial motor strength is symmetric and tongue and uvula move midline.  Neck ROM : rotation, tilt and flexion extension were normal for age and shoulder shrug was symmetrical.    Motor exam:  Symmetric bulk, tone and ROM.   Normal tone without cog-wheeling, symmetric grip strength .   Sensory:  Fine touch, pinprick and vibration were tested  and  normal.  Proprioception tested in the upper extremities was normal.   Coordination: Rapid alternating movements in the fingers/hands were of normal speed.  The Finger-to-nose maneuver was intact without evidence of ataxia, dysmetria or tremor.   Gait and station: Patient could rise unassisted from a seated position, walked without assistive device.  Stance is of normal width/ base .  Toe and heel walk were deferred.  Deep tendon reflexes: in the  upper and lower extremities are symmetric and intact.  Babinski response was deferred .       After spending  a total time of  45 minutes face to face and additional time for physical and neurologic examination, review of laboratory studies,  personal review of imaging studies, reports and results of other testing and review of referral information / records as far as provided in visit, I have established the following assessments:  1) Insomnia with early morning awakening.  2) no hypersomnia.  3) 2 different spells of possible migraines. No seizure is likely, it was a strict sensory deficit.  Discussed new medication not as preventive ( not frequent enough) but abortive medication. (triptans versus calcitonin protein) And UBRELVY>     My Plan is to proceed with:  1) HST for screening for apnea.  2) Bernita Raisin for " migraine " , so infrequent and associated with anxiety that I don't want to use TRIPTAN .  3) consider treating anxiety with Buspar/ trazodone or similar.   I would like to thank Marden Noble, MD and Marvel Plan, Md [address] [place],  [place] [address] for allowing me to meet with and to take care of this pleasant patient.   In short, Joshua Harrison is presenting with infrequent complicated migraine, anxiety , insomnia. .  I plan to follow up either personally or through our NP within 3 month.   CC: I will share my notes with PCP .  Electronically signed by: Melvyn Novas, MD 09/19/2020 9:11 AM  [facility] Board certified by The [employer] of Sleep Medicine and Diplomate of the [employer] of Sleep Medicine. Board certified In Neurology through the ABPN, Fellow of the [employer] of Neurology. Medical Director of [facility].

## 2020-09-19 NOTE — Patient Instructions (Signed)
Screening for Sleep Apnea  Sleep apnea is a condition in which breathing pauses or becomes shallow during sleep. Sleep apnea screening is a test to determine if you are at risk for sleep apnea. The test is easy and only takes a few minutes. Your health care provider may ask you to have this test in preparation for surgery or as part of a physical exam. What are the symptoms of sleep apnea? Common symptoms of sleep apnea include:  Snoring.  Restless sleep.  Daytime sleepiness.  Pauses in breathing.  Choking during sleep.  Irritability.  Forgetfulness.  Trouble thinking clearly.  Depression.  Personality changes. Most people with sleep apnea are not aware that they have it. Why should I get screened? Getting screened for sleep apnea can help:  Ensure your safety. It is important for your health care providers to know whether or not you have sleep apnea, especially if you are having surgery or have other long-term (chronic) health conditions.  Improve your health and allow you to get a better night's rest. Restful sleep can help you: ? Have more energy. ? Lose weight. ? Improve high blood pressure. ? Improve diabetes management. ? Prevent stroke. ? Prevent car accidents. How is screening done? Screening usually includes being asked a list of questions about your sleep quality. Some questions you may be asked include:  Do you snore?  Is your sleep restless?  Do you have daytime sleepiness?  Has a partner or spouse told you that you stop breathing during sleep?  Have you had trouble concentrating or memory loss? If your screening test is positive, you are at risk for the condition. Further testing may be needed to confirm a diagnosis of sleep apnea. Where to find more information You can find screening tools online or at your health care clinic. For more information about sleep apnea screening and healthy sleep, visit these websites:  Centers for Disease Control and  Prevention: LearningDermat[employer].pl  American Sleep Apnea Association: www.sleepapnea.org Contact a health care provider if:  You think that you may have sleep apnea. Summary  Sleep apnea screening can help determine if you are at risk for sleep apnea.  It is important for your health care providers to know whether or not you have sleep apnea, especially if you are having surgery or have other chronic health conditions.  You may be asked to take a screening test for sleep apnea in preparation for surgery or as part of a physical exam. This information is not intended to replace advice given to you by your health care provider. Make sure you discuss any questions you have with your health care provider. Document Revised: 05/23/2018 Document Reviewed: 11/16/2016 Elsevier Patient Education  2021 [place]. Insomnia Insomnia is a sleep disorder that makes it difficult to fall asleep or stay asleep. Insomnia can cause fatigue, low energy, difficulty concentrating, mood swings, and poor performance at work or school. There are three different ways to classify insomnia:  Difficulty falling asleep.  Difficulty staying asleep.  Waking up too early in the morning. Any type of insomnia can be long-term (chronic) or short-term (acute). Both are common. Short-term insomnia usually lasts for three months or less. Chronic insomnia occurs at least three times a week for longer than three months. What are the causes? Insomnia may be caused by another condition, situation, or substance, such as:  Anxiety.  Certain medicines.  Gastroesophageal reflux disease (GERD) or other gastrointestinal conditions.  Asthma or other breathing conditions.  Restless legs  syndrome, sleep apnea, or other sleep disorders.  Chronic pain.  Menopause.  Stroke.  Abuse of alcohol, tobacco, or illegal drugs.  Mental health conditions, such as depression.  Caffeine.  Neurological disorders, such as  Alzheimer's disease.  An overactive thyroid (hyperthyroidism). Sometimes, the cause of insomnia may not be known. What increases the risk? Risk factors for insomnia include:  Gender. Women are affected more often than men.  Age. Insomnia is more common as you get older.  Stress.  Lack of exercise.  Irregular work schedule or working night shifts.  Traveling between different time zones.  Certain medical and mental health conditions. What are the signs or symptoms? If you have insomnia, the main symptom is having trouble falling asleep or having trouble staying asleep. This may lead to other symptoms, such as:  Feeling fatigued or having low energy.  Feeling nervous about going to sleep.  Not feeling rested in the morning.  Having trouble concentrating.  Feeling irritable, anxious, or depressed. How is this diagnosed? This condition may be diagnosed based on:  Your symptoms and medical history. Your health care provider may ask about: ? Your sleep habits. ? Any medical conditions you have. ? Your mental health.  A physical exam. How is this treated? Treatment for insomnia depends on the cause. Treatment may focus on treating an underlying condition that is causing insomnia. Treatment may also include:  Medicines to help you sleep.  Counseling or therapy.  Lifestyle adjustments to help you sleep better. Follow these instructions at home: Eating and drinking  Limit or avoid alcohol, caffeinated beverages, and cigarettes, especially close to bedtime. These can disrupt your sleep.  Do not eat a large meal or eat spicy foods right before bedtime. This can lead to digestive discomfort that can make it hard for you to sleep.   Sleep habits  Keep a sleep diary to help you and your health care provider figure out what could be causing your insomnia. Write down: ? When you sleep. ? When you wake up during the night. ? How well you sleep. ? How rested you feel the  next day. ? Any side effects of medicines you are taking. ? What you eat and drink.  Make your bedroom a dark, comfortable place where it is easy to fall asleep. ? Put up shades or blackout curtains to block light from outside. ? Use a white noise machine to block noise. ? Keep the temperature cool.  Limit screen use before bedtime. This includes: ? Watching TV. ? Using your smartphone, tablet, or computer.  Stick to a routine that includes going to bed and waking up at the same times every day and night. This can help you fall asleep faster. Consider making a quiet activity, such as reading, part of your nighttime routine.  Try to avoid taking naps during the day so that you sleep better at night.  Get out of bed if you are still awake after 15 minutes of trying to sleep. Keep the lights down, but try reading or doing a quiet activity. When you feel sleepy, go back to bed.   General instructions  Take over-the-counter and prescription medicines only as told by your health care provider.  Exercise regularly, as told by your health care provider. Avoid exercise starting several hours before bedtime.  Use relaxation techniques to manage stress. Ask your health care provider to suggest some techniques that may work well for you. These may include: ? Breathing exercises. ? Routines to release  muscle tension. ? Visualizing peaceful scenes.  Make sure that you drive carefully. Avoid driving if you feel very sleepy.  Keep all follow-up visits as told by your health care provider. This is important. Contact a health care provider if:  You are tired throughout the day.  You have trouble in your daily routine due to sleepiness.  You continue to have sleep problems, or your sleep problems get worse. Get help right away if:  You have serious thoughts about hurting yourself or someone else. If you ever feel like you may hurt yourself or others, or have thoughts about taking your own  life, get help right away. You can go to your nearest emergency department or call:  Your local emergency services (911 in the [place]  A suicide crisis helpline, such as the National Suicide Prevention Lifeline at 1-800-273-8255. This is open 24 hours a day. Summary  Insomnia is a sleep disorder that makes it difficult to fall asleep or stay asleep.  Insomnia can be long-term (chronic) or short-term (acute).  Treatment for insomnia depends on the cause. Treatment may focus on treating an underlying condition that is causing insomnia.  Keep a sleep diary to help you and your health care provider figure out what could be causing your insomnia. This information is not intended to replace advice given to you by your health care provider. Make sure you discuss any questions you have with your health care provider. Document Revised: 06/16/2020 Document Reviewed: 06/16/2020 Elsevier Patient Education  2021 Elsevier Inc.  

## 2020-09-20 ENCOUNTER — Telehealth: Payer: Self-pay

## 2020-09-20 NOTE — Telephone Encounter (Signed)
Calling to schedule sleep study. 

## 2020-09-20 NOTE — Telephone Encounter (Signed)
Received PA request for Ubrelvy.  Completed via CMM.  Sent to [employer].  Should have a determination within 3-5 business days.Key: [id number].

## 2020-09-20 NOTE — Telephone Encounter (Signed)
PA approved from 09/20/20-03/19/21 through [employer].

## 2020-09-21 MED FILL — VIBERZI 100 MG TABS: 100 | 90 days supply | Qty: 90 | Fill #0

## 2020-09-23 DIAGNOSIS — R000 Tachycardia, unspecified: Secondary | ICD-10-CM | POA: Diagnosis not present

## 2020-09-26 ENCOUNTER — Telehealth: Payer: Self-pay

## 2020-09-26 NOTE — Telephone Encounter (Signed)
LVM for pt to call me back to schedule sleep study  

## 2020-09-28 ENCOUNTER — Telehealth: Payer: Self-pay

## 2020-09-28 NOTE — Telephone Encounter (Signed)
We have attempted to call the patient two times to schedule sleep study.  Patient has been unavailable at the phone numbers we have on file and has not returned our calls. If patient calls back we will schedule them for their sleep study.  

## 2020-10-06 DIAGNOSIS — R000 Tachycardia, unspecified: Secondary | ICD-10-CM | POA: Diagnosis not present

## 2020-10-10 DIAGNOSIS — R000 Tachycardia, unspecified: Secondary | ICD-10-CM | POA: Diagnosis not present

## 2020-12-19 ENCOUNTER — Other Ambulatory Visit (HOSPITAL_COMMUNITY): Payer: Self-pay

## 2020-12-19 MED FILL — Eluxadoline Tab 100 MG: ORAL | 90 days supply | Qty: 90 | Fill #0 | Status: AC

## 2020-12-20 ENCOUNTER — Other Ambulatory Visit (HOSPITAL_BASED_OUTPATIENT_CLINIC_OR_DEPARTMENT_OTHER): Payer: Self-pay

## 2020-12-20 ENCOUNTER — Other Ambulatory Visit (HOSPITAL_COMMUNITY): Payer: Self-pay

## 2020-12-28 ENCOUNTER — Other Ambulatory Visit (HOSPITAL_BASED_OUTPATIENT_CLINIC_OR_DEPARTMENT_OTHER): Payer: Self-pay

## 2020-12-28 MED ORDER — COVID-19 AT HOME ANTIGEN TEST VI KIT
PACK | 0 refills | Status: DC
  Filled 2020-12-28: qty 4, 8d supply, fill #0

## 2021-01-13 DIAGNOSIS — R002 Palpitations: Secondary | ICD-10-CM | POA: Diagnosis not present

## 2021-01-18 DIAGNOSIS — Z03818 Encounter for observation for suspected exposure to other biological agents ruled out: Secondary | ICD-10-CM | POA: Diagnosis not present

## 2021-01-19 DIAGNOSIS — B084 Enteroviral vesicular stomatitis with exanthem: Secondary | ICD-10-CM | POA: Diagnosis not present

## 2021-02-03 DIAGNOSIS — R002 Palpitations: Secondary | ICD-10-CM | POA: Diagnosis not present

## 2021-02-03 DIAGNOSIS — I471 Supraventricular tachycardia: Secondary | ICD-10-CM | POA: Diagnosis not present

## 2021-02-17 ENCOUNTER — Other Ambulatory Visit (HOSPITAL_COMMUNITY): Payer: Self-pay

## 2021-02-17 MED ORDER — CARESTART COVID-19 HOME TEST VI KIT
PACK | 0 refills | Status: DC
  Filled 2021-02-17: qty 4, 4d supply, fill #0

## 2021-02-27 DIAGNOSIS — Z91030 Bee allergy status: Secondary | ICD-10-CM | POA: Diagnosis not present

## 2021-02-27 DIAGNOSIS — I471 Supraventricular tachycardia: Secondary | ICD-10-CM | POA: Diagnosis not present

## 2021-02-27 DIAGNOSIS — Z13220 Encounter for screening for lipoid disorders: Secondary | ICD-10-CM | POA: Diagnosis not present

## 2021-02-27 DIAGNOSIS — K580 Irritable bowel syndrome with diarrhea: Secondary | ICD-10-CM | POA: Diagnosis not present

## 2021-02-27 DIAGNOSIS — Z0000 Encounter for general adult medical examination without abnormal findings: Secondary | ICD-10-CM | POA: Diagnosis not present

## 2021-03-08 ENCOUNTER — Telehealth: Payer: Self-pay | Admitting: Neurology

## 2021-03-08 NOTE — Telephone Encounter (Signed)
PA completed on CMM/[employer]  KEY: [id number]  request has been approved. The authorization is effective for a maximum of 12 fills from 03/08/2021 to 03/07/2022, as long as the member is enrolled in their current health plan. This has been approved for a quantity limit of 16 with a day supply limit of 30.

## 2021-03-20 ENCOUNTER — Other Ambulatory Visit (HOSPITAL_COMMUNITY): Payer: Self-pay

## 2021-03-21 ENCOUNTER — Other Ambulatory Visit (HOSPITAL_COMMUNITY): Payer: Self-pay

## 2021-03-21 MED ORDER — VIBERZI 100 MG PO TABS
1.0000 | ORAL_TABLET | Freq: Every day | ORAL | 1 refills | Status: DC
  Filled 2021-03-21: qty 90, 90d supply, fill #0
  Filled 2021-06-19: qty 90, 90d supply, fill #1

## 2021-03-22 ENCOUNTER — Other Ambulatory Visit (HOSPITAL_COMMUNITY): Payer: Self-pay

## 2021-04-06 ENCOUNTER — Other Ambulatory Visit (HOSPITAL_COMMUNITY): Payer: Self-pay

## 2021-04-06 MED ORDER — CARESTART COVID-19 HOME TEST VI KIT
PACK | 0 refills | Status: AC
  Filled 2021-04-06: qty 4, 4d supply, fill #0

## 2021-05-15 IMAGING — CT CT HEAD CODE STROKE
3 series · 15 of 47 positions shown, 18 images · non-contrast
Comparison: MRI head 07/08/2009.

CLINICAL DATA: Code stroke. Neuro deficit, acute, stroke suspected.

EXAM:
CT HEAD WITHOUT CONTRAST
TECHNIQUE: Contiguous axial images were obtained from the base of the skull
through the vertex without intravenous contrast.

[Series 3: head 5.0 h30s · axial · 0.43mm/px · z∈[-100,+35]mm · 9 of 33 slices shown, 12 images]
[im 3/33  brain]
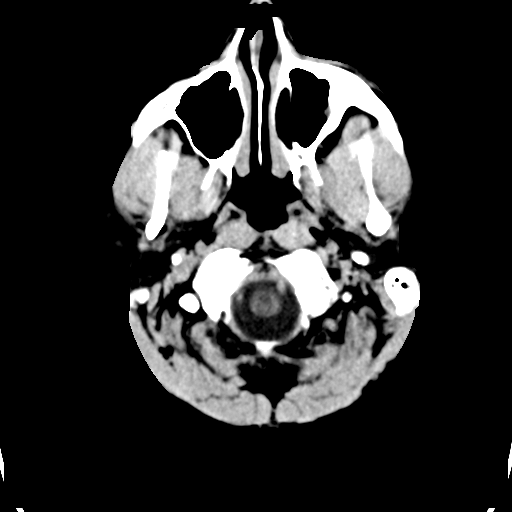
[im 3/33  bone]
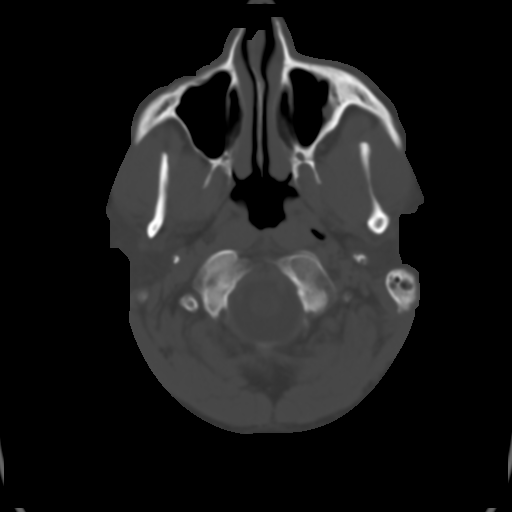
[im 6/33  brain]
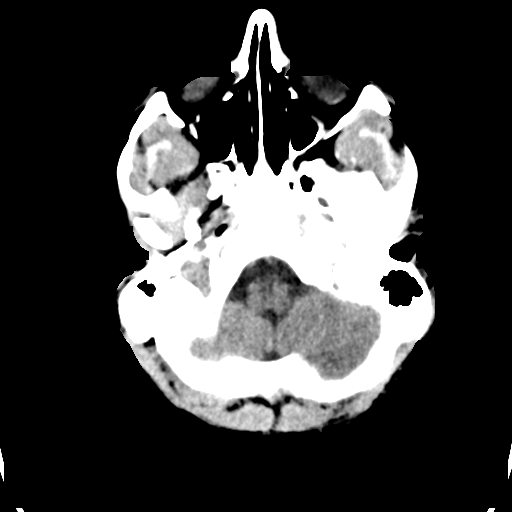
[im 9/33  brain]
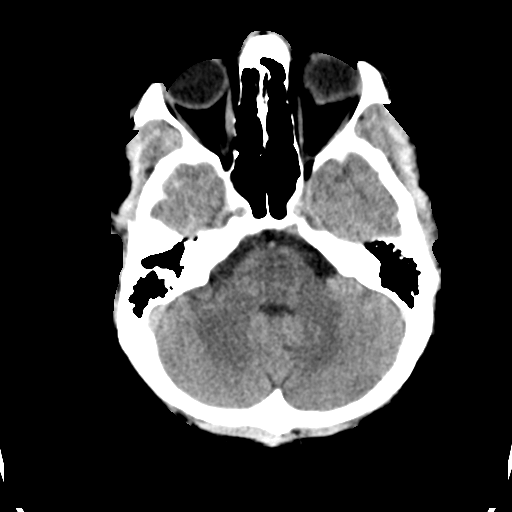
[im 13/33  brain]
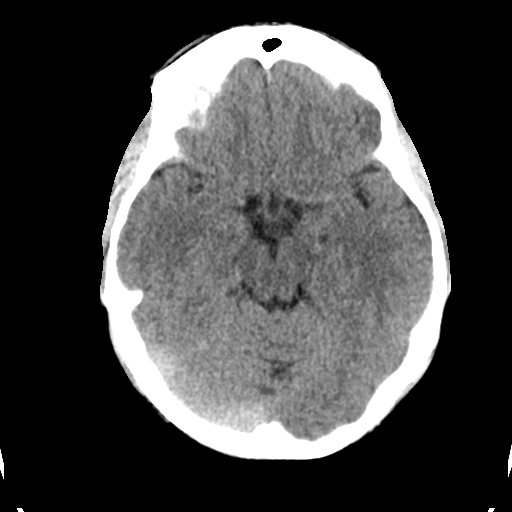
[im 17/33  brain]
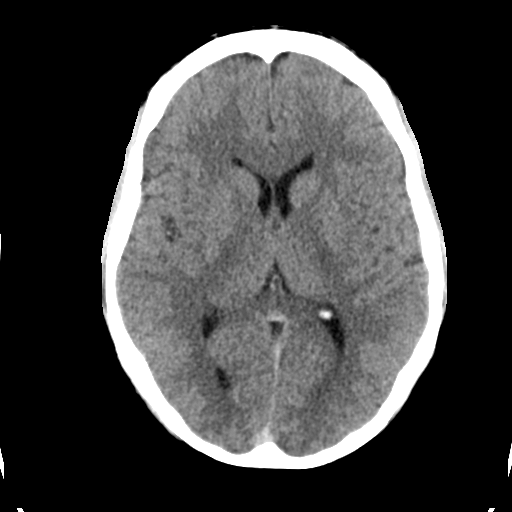
[im 17/33  bone]
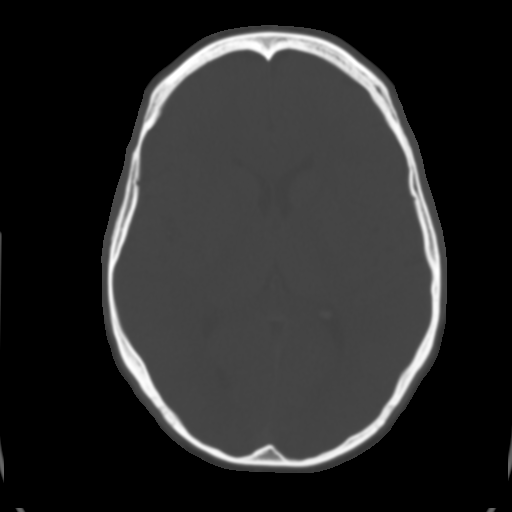
[im 20/33  brain]
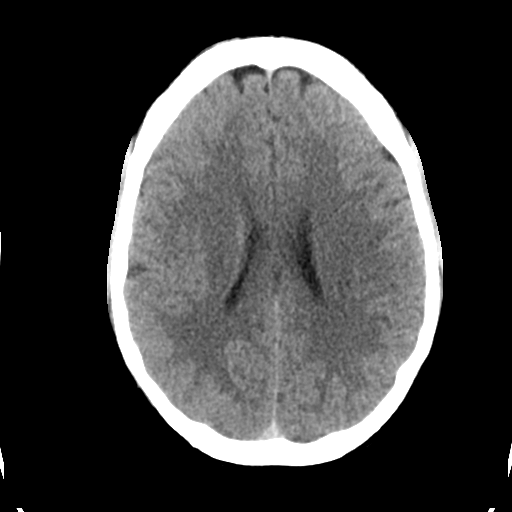
[im 24/33  brain]
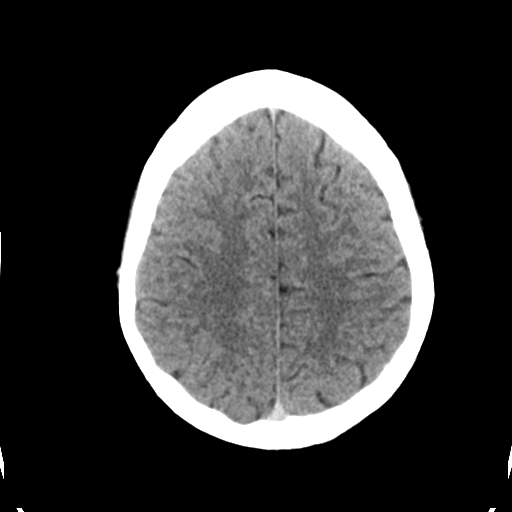
[im 27/33  brain]
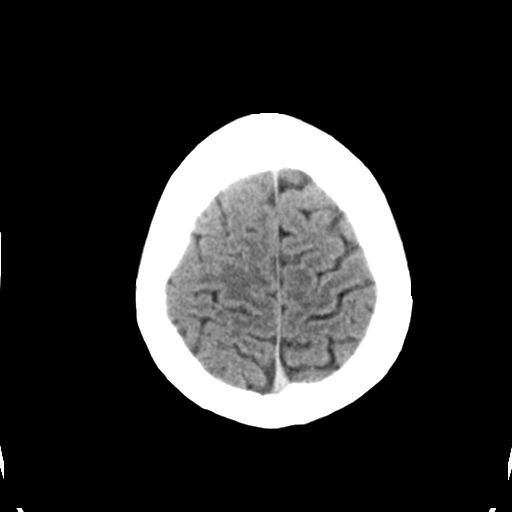
[im 30/33  brain]
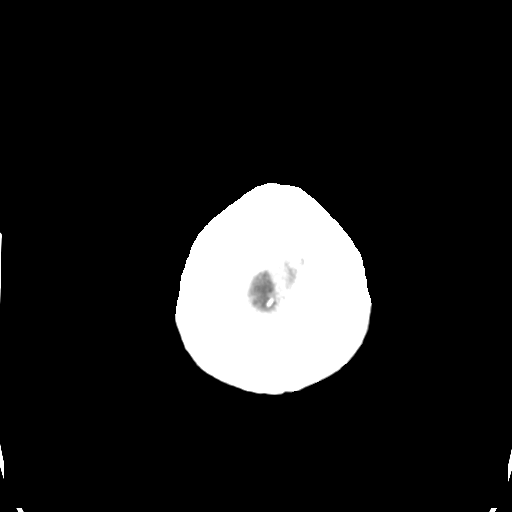
[im 30/33  bone]
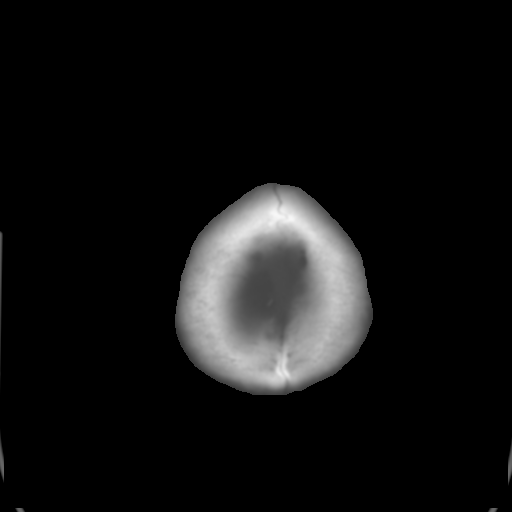

[Series 5: head 3.0 mpr cor · coronal · 0.32mm/px · 3 of 73 slices shown]
[im 25/73  brain]
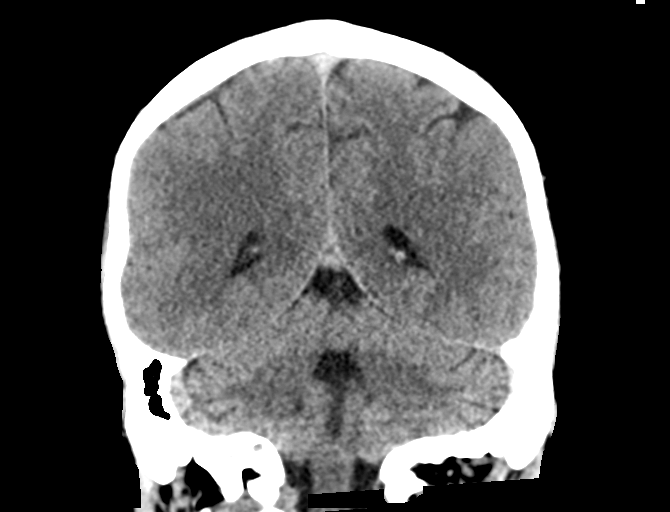
[im 33/73  brain]
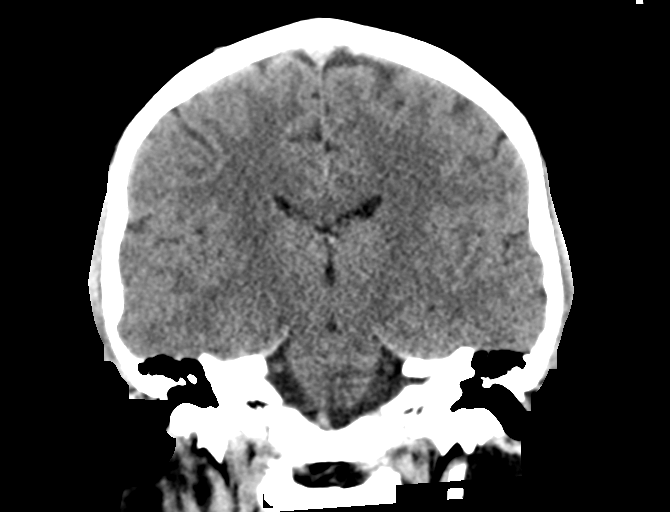
[im 41/73  brain]
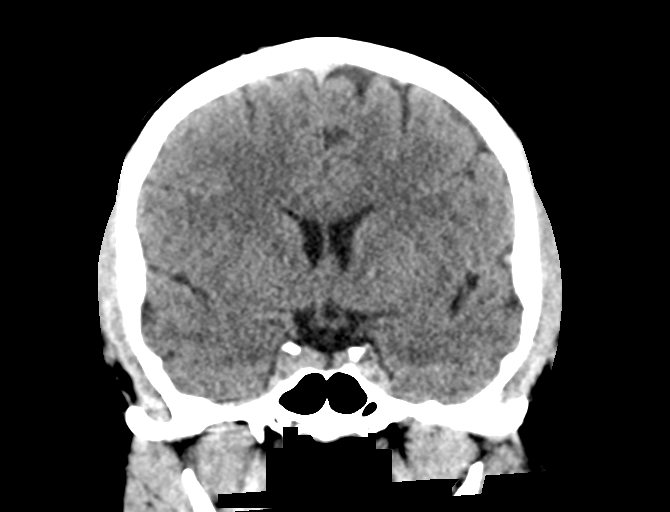

[Series 6: head 3.0 mpr sag · sagittal · 0.32mm/px · 3 of 66 slices shown]
[im 22/66  brain]
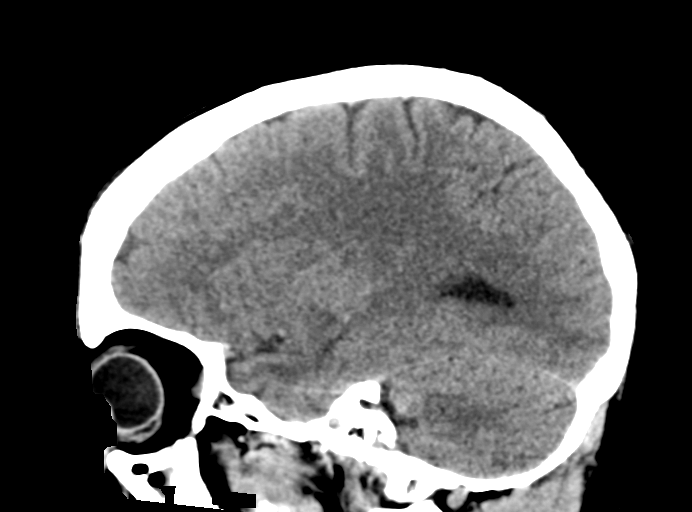
[im 33/66  brain]
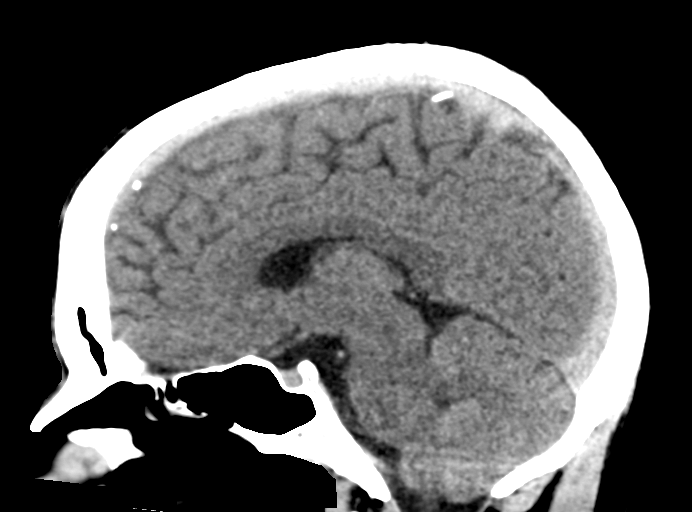
[im 44/66  brain]
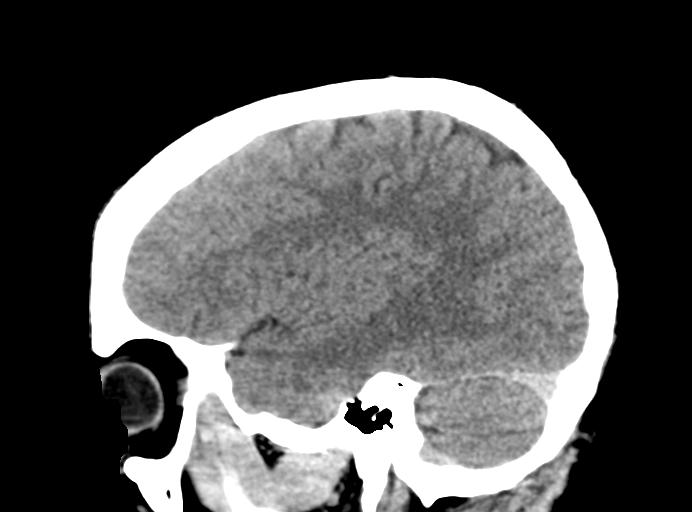

[15 of 47 positions shown; findings below may reference images not displayed]

FINDINGS: Brain: No acute hemorrhage. Apparent loss of gray-white
differentiation in the left occipital lobe (for example series 3,
image 16). No other areas of gray-white differentiation loss
identified. No mass lesion. No mass effect. No hydrocephalus.

Vascular: No definite hyperdense vessel or unexpected calcification.

Skull: Normal. Negative for fracture or focal lesion.

Sinuses/Orbits: Mild mucosal thickening of the inferior left
maxillary sinus.

ASPECTS (Al[place]ta Stroke Program Early CT Score): 10
IMPRESSION: 1. Apparent loss of gray-white differentiation in the left occipital
lobe (left PCA territory) is concerning for acute infarct, although
this may be artifactual. If there is concern for acute infarct,
recommend MRI to further evaluate.
2. No acute hemorrhage.

pm to provider Amazigh via telephone, who verbally acknowledged these
results.

## 2021-06-19 ENCOUNTER — Other Ambulatory Visit (HOSPITAL_COMMUNITY): Payer: Self-pay

## 2021-06-20 ENCOUNTER — Other Ambulatory Visit (HOSPITAL_COMMUNITY): Payer: Self-pay

## 2021-08-08 ENCOUNTER — Other Ambulatory Visit (HOSPITAL_COMMUNITY): Payer: Self-pay

## 2021-08-08 MED ORDER — CARESTART COVID-19 HOME TEST VI KIT
PACK | 0 refills | Status: AC
  Filled 2021-08-08: qty 4, 1d supply, fill #0

## 2021-09-11 ENCOUNTER — Other Ambulatory Visit (HOSPITAL_COMMUNITY): Payer: Self-pay

## 2021-09-14 ENCOUNTER — Other Ambulatory Visit (HOSPITAL_COMMUNITY): Payer: Self-pay

## 2021-09-15 ENCOUNTER — Other Ambulatory Visit (HOSPITAL_COMMUNITY): Payer: Self-pay

## 2021-09-15 MED ORDER — VIBERZI 100 MG PO TABS
ORAL_TABLET | ORAL | 3 refills | Status: DC
  Filled 2021-09-15: qty 90, 90d supply, fill #0
  Filled 2021-12-08: qty 90, 90d supply, fill #1

## 2021-09-18 ENCOUNTER — Other Ambulatory Visit (HOSPITAL_COMMUNITY): Payer: Self-pay

## 2021-10-18 DIAGNOSIS — K580 Irritable bowel syndrome with diarrhea: Secondary | ICD-10-CM | POA: Diagnosis not present

## 2021-11-10 DIAGNOSIS — J029 Acute pharyngitis, unspecified: Secondary | ICD-10-CM | POA: Diagnosis not present

## 2021-12-08 ENCOUNTER — Other Ambulatory Visit (HOSPITAL_COMMUNITY): Payer: Self-pay

## 2021-12-13 ENCOUNTER — Other Ambulatory Visit (HOSPITAL_COMMUNITY): Payer: Self-pay

## 2021-12-16 ENCOUNTER — Other Ambulatory Visit (HOSPITAL_COMMUNITY): Payer: Self-pay

## 2021-12-18 ENCOUNTER — Other Ambulatory Visit (HOSPITAL_COMMUNITY): Payer: Self-pay

## 2021-12-19 ENCOUNTER — Other Ambulatory Visit (HOSPITAL_COMMUNITY): Payer: Self-pay

## 2022-01-16 ENCOUNTER — Ambulatory Visit: Payer: 59 | Admitting: Physician Assistant

## 2022-01-16 ENCOUNTER — Encounter: Payer: Self-pay | Admitting: Physician Assistant

## 2022-01-16 DIAGNOSIS — L858 Other specified epidermal thickening: Secondary | ICD-10-CM

## 2022-01-16 DIAGNOSIS — L700 Acne vulgaris: Secondary | ICD-10-CM

## 2022-01-16 DIAGNOSIS — Z1283 Encounter for screening for malignant neoplasm of skin: Secondary | ICD-10-CM

## 2022-01-16 MED ORDER — TRIAMCINOLONE ACETONIDE 0.1 % EX OINT: 1.0000 | TOPICAL_OINTMENT | Freq: Two times a day (BID) | CUTANEOUS | 11 refills | Status: AC | PRN

## 2022-01-16 MED ORDER — TRETINOIN 0.025 % EX CREA: TOPICAL_CREAM | Freq: Every day | CUTANEOUS | 4 refills | Status: AC

## 2022-01-16 NOTE — Patient Instructions (Signed)
Over the counter Differin gel nightly

## 2022-01-17 ENCOUNTER — Encounter: Payer: Self-pay | Admitting: Physician Assistant

## 2022-01-17 NOTE — Progress Notes (Signed)
   Follow-Up Visit   Subjective  Joshua Harrison is a 36 y.o. male who presents for the following: New Patient (Initial Visit) (Top of both legs acne like lesions and left upper arm x months raised KP like lesions. Pervious isotret patient last visit 2019 ).   The following portions of the chart were reviewed this encounter and updated as appropriate:  Tobacco  Allergies  Meds  Problems  Med Hx  Surg Hx  Fam Hx      Objective  Well appearing patient in no apparent distress; mood and affect are within normal limits.  A full examination was performed including scalp, head, eyes, ears, nose, lips, neck, chest, axillae, abdomen, back, buttocks, bilateral upper extremities, bilateral lower extremities, hands, feet, fingers, toes, fingernails, and toenails. All findings within normal limits unless otherwise noted below.  No atypical nevi or signs of NMSC noted at the time of the visit. Full body skin check   left upper arm and anterior thighs. Perifollicular erythema  Head - Anterior (Face) Mostly clear today with evidence of severe acne pitting in the past.    Assessment & Plan  Keratosis pilaris left upper arm and anterior thighs.  Over the counter Differin gel nightly as tolerated.    triamcinolone ointment (KENALOG) 0.1 % - left upper arm and anterior thighs. Apply 1 application. topically 2 (two) times daily as needed (Rash).  Acne vulgaris Head - Anterior (Face)  tretinoin (RETIN-A) 0.025 % cream - Head - Anterior (Face) Apply topically at bedtime.  Screening exam for skin cancer  Yearly skin exams    I, Asja Frommer, PA-C, have reviewed all documentation's for this visit.  The documentation on 01/17/22 for the exam, diagnosis, procedures and orders are all accurate and complete.

## 2022-02-06 ENCOUNTER — Telehealth: Payer: Self-pay

## 2022-02-06 ENCOUNTER — Telehealth: Payer: Self-pay | Admitting: *Deleted

## 2022-02-06 NOTE — Telephone Encounter (Signed)
Fax received from patient's pharmacy needing a prior authorization for his Tretinoin.

## 2022-02-06 NOTE — Telephone Encounter (Signed)
Prior authorization done through cover my meds for the patient's Tretinoin.   [employer] is reviewing your PA request. You may close this dialog, return to your dashboard, and perform other tasks.  To check for an update later, open this request again from your dashboard. If [employer] has not replied within 24 hours for urgent requests or within 48 hours for standard requests, please contact [employer] at [phone].

## 2022-02-06 NOTE — Telephone Encounter (Signed)
Prior authorization done via cover my meds- Tretinoin 0.025 Cream approved. Prior Authorization reference number- [id number].

## 2022-03-07 ENCOUNTER — Other Ambulatory Visit (HOSPITAL_COMMUNITY): Payer: Self-pay

## 2022-03-07 DIAGNOSIS — Z3009 Encounter for other general counseling and advice on contraception: Secondary | ICD-10-CM | POA: Diagnosis not present

## 2022-03-07 MED ORDER — DIAZEPAM 10 MG PO TABS
ORAL_TABLET | ORAL | 0 refills | Status: AC
  Filled 2022-03-07: qty 2, 1d supply, fill #0

## 2022-03-16 ENCOUNTER — Other Ambulatory Visit (HOSPITAL_COMMUNITY): Payer: Self-pay

## 2022-03-19 ENCOUNTER — Other Ambulatory Visit (HOSPITAL_COMMUNITY): Payer: Self-pay

## 2022-03-20 ENCOUNTER — Other Ambulatory Visit (HOSPITAL_COMMUNITY): Payer: Self-pay

## 2022-03-20 DIAGNOSIS — Z13220 Encounter for screening for lipoid disorders: Secondary | ICD-10-CM | POA: Diagnosis not present

## 2022-03-20 DIAGNOSIS — Z0000 Encounter for general adult medical examination without abnormal findings: Secondary | ICD-10-CM | POA: Diagnosis not present

## 2022-03-20 DIAGNOSIS — K580 Irritable bowel syndrome with diarrhea: Secondary | ICD-10-CM | POA: Diagnosis not present

## 2022-03-20 DIAGNOSIS — I471 Supraventricular tachycardia: Secondary | ICD-10-CM | POA: Diagnosis not present

## 2022-03-20 DIAGNOSIS — G43909 Migraine, unspecified, not intractable, without status migrainosus: Secondary | ICD-10-CM | POA: Diagnosis not present

## 2022-03-20 DIAGNOSIS — Z91030 Bee allergy status: Secondary | ICD-10-CM | POA: Diagnosis not present

## 2022-03-20 MED ORDER — EPINEPHRINE 0.3 MG/0.3ML IJ SOAJ
INTRAMUSCULAR | 3 refills | Status: AC
  Filled 2022-03-20: qty 2, 30d supply, fill #0

## 2022-03-20 MED ORDER — VIBERZI 100 MG PO TABS
ORAL_TABLET | ORAL | 2 refills | Status: DC
  Filled 2022-03-20: qty 90, 90d supply, fill #0
  Filled 2022-06-14: qty 90, 90d supply, fill #1
  Filled 2022-09-04: qty 90, 90d supply, fill #2

## 2022-05-09 DIAGNOSIS — Z302 Encounter for sterilization: Secondary | ICD-10-CM | POA: Diagnosis not present

## 2022-06-14 ENCOUNTER — Other Ambulatory Visit (HOSPITAL_COMMUNITY): Payer: Self-pay

## 2022-06-15 ENCOUNTER — Other Ambulatory Visit (HOSPITAL_COMMUNITY): Payer: Self-pay

## 2022-09-04 ENCOUNTER — Other Ambulatory Visit (HOSPITAL_COMMUNITY): Payer: Self-pay

## 2022-09-05 ENCOUNTER — Other Ambulatory Visit (HOSPITAL_COMMUNITY): Payer: Self-pay

## 2022-11-22 ENCOUNTER — Other Ambulatory Visit (HOSPITAL_COMMUNITY): Payer: Self-pay

## 2022-11-23 ENCOUNTER — Other Ambulatory Visit (HOSPITAL_COMMUNITY): Payer: Self-pay

## 2022-11-23 MED ORDER — VIBERZI 100 MG PO TABS
100.0000 mg | ORAL_TABLET | Freq: Every day | ORAL | 0 refills | Status: AC
  Filled 2022-11-23: qty 90, 90d supply, fill #0

## 2022-11-26 ENCOUNTER — Other Ambulatory Visit (HOSPITAL_COMMUNITY): Payer: Self-pay

## 2022-12-18 DIAGNOSIS — K580 Irritable bowel syndrome with diarrhea: Secondary | ICD-10-CM | POA: Diagnosis not present

## 2023-01-22 ENCOUNTER — Ambulatory Visit: Payer: 59 | Admitting: Physician Assistant

## 2023-03-01 ENCOUNTER — Other Ambulatory Visit (HOSPITAL_COMMUNITY): Payer: Self-pay

## 2023-03-06 ENCOUNTER — Other Ambulatory Visit (HOSPITAL_COMMUNITY): Payer: Self-pay

## 2023-03-07 ENCOUNTER — Other Ambulatory Visit (HOSPITAL_COMMUNITY): Payer: Self-pay

## 2023-03-07 MED ORDER — VIBERZI 100 MG PO TABS
100.0000 mg | ORAL_TABLET | Freq: Every day | ORAL | 1 refills | Status: DC
  Filled 2023-03-07: qty 30, 30d supply, fill #0
  Filled 2023-03-14: qty 60, 60d supply, fill #0
  Filled 2023-06-06: qty 90, 90d supply, fill #1

## 2023-03-08 ENCOUNTER — Other Ambulatory Visit (HOSPITAL_COMMUNITY): Payer: Self-pay

## 2023-03-11 ENCOUNTER — Other Ambulatory Visit (HOSPITAL_COMMUNITY): Payer: Self-pay

## 2023-03-12 ENCOUNTER — Other Ambulatory Visit (HOSPITAL_COMMUNITY): Payer: Self-pay

## 2023-03-12 ENCOUNTER — Other Ambulatory Visit (HOSPITAL_BASED_OUTPATIENT_CLINIC_OR_DEPARTMENT_OTHER): Payer: Self-pay

## 2023-03-13 ENCOUNTER — Other Ambulatory Visit (HOSPITAL_COMMUNITY): Payer: Self-pay

## 2023-03-14 ENCOUNTER — Other Ambulatory Visit: Payer: Self-pay

## 2023-03-14 ENCOUNTER — Other Ambulatory Visit (HOSPITAL_COMMUNITY): Payer: Self-pay

## 2023-03-19 ENCOUNTER — Other Ambulatory Visit (HOSPITAL_COMMUNITY): Payer: Self-pay

## 2023-04-10 DIAGNOSIS — J3489 Other specified disorders of nose and nasal sinuses: Secondary | ICD-10-CM | POA: Diagnosis not present

## 2023-04-16 DIAGNOSIS — K580 Irritable bowel syndrome with diarrhea: Secondary | ICD-10-CM | POA: Diagnosis not present

## 2023-04-16 DIAGNOSIS — K219 Gastro-esophageal reflux disease without esophagitis: Secondary | ICD-10-CM | POA: Diagnosis not present

## 2023-04-16 DIAGNOSIS — Z91030 Bee allergy status: Secondary | ICD-10-CM | POA: Diagnosis not present

## 2023-04-16 DIAGNOSIS — Z0000 Encounter for general adult medical examination without abnormal findings: Secondary | ICD-10-CM | POA: Diagnosis not present

## 2023-04-16 DIAGNOSIS — R799 Abnormal finding of blood chemistry, unspecified: Secondary | ICD-10-CM | POA: Diagnosis not present

## 2023-05-09 ENCOUNTER — Other Ambulatory Visit (HOSPITAL_COMMUNITY): Payer: Self-pay

## 2023-05-09 DIAGNOSIS — R12 Heartburn: Secondary | ICD-10-CM | POA: Diagnosis not present

## 2023-05-09 MED ORDER — OMEPRAZOLE 20 MG PO CPDR
20.0000 mg | DELAYED_RELEASE_CAPSULE | Freq: Every morning | ORAL | 3 refills | Status: AC
  Filled 2023-05-09: qty 30, 30d supply, fill #0
  Filled 2023-06-06: qty 30, 30d supply, fill #1
  Filled 2024-02-26: qty 30, 30d supply, fill #2
  Filled 2024-04-15: qty 30, 30d supply, fill #3

## 2023-06-06 ENCOUNTER — Other Ambulatory Visit (HOSPITAL_COMMUNITY): Payer: Self-pay

## 2023-06-07 ENCOUNTER — Other Ambulatory Visit (HOSPITAL_COMMUNITY): Payer: Self-pay

## 2023-06-10 ENCOUNTER — Other Ambulatory Visit (HOSPITAL_COMMUNITY): Payer: Self-pay

## 2023-09-02 ENCOUNTER — Other Ambulatory Visit (HOSPITAL_COMMUNITY): Payer: Self-pay

## 2023-09-05 ENCOUNTER — Other Ambulatory Visit (HOSPITAL_COMMUNITY): Payer: Self-pay

## 2023-09-05 MED ORDER — VIBERZI 100 MG PO TABS
100.0000 mg | ORAL_TABLET | Freq: Every day | ORAL | 2 refills | Status: AC
  Filled 2023-09-05: qty 90, 90d supply, fill #0
  Filled 2023-12-02 – 2023-12-03 (×2): qty 90, 90d supply, fill #1
  Filled 2024-02-26 – 2024-03-02 (×2): qty 90, 90d supply, fill #2

## 2023-09-06 ENCOUNTER — Other Ambulatory Visit (HOSPITAL_COMMUNITY): Payer: Self-pay

## 2023-12-02 ENCOUNTER — Other Ambulatory Visit (HOSPITAL_COMMUNITY): Payer: Self-pay

## 2023-12-03 ENCOUNTER — Other Ambulatory Visit (HOSPITAL_COMMUNITY): Payer: Self-pay

## 2024-01-27 ENCOUNTER — Other Ambulatory Visit (HOSPITAL_COMMUNITY): Payer: Self-pay

## 2024-01-27 DIAGNOSIS — M25512 Pain in left shoulder: Secondary | ICD-10-CM | POA: Diagnosis not present

## 2024-01-27 DIAGNOSIS — M542 Cervicalgia: Secondary | ICD-10-CM | POA: Diagnosis not present

## 2024-01-27 MED ORDER — METHOCARBAMOL 500 MG PO TABS
500.0000 mg | ORAL_TABLET | Freq: Four times a day (QID) | ORAL | 0 refills | Status: AC
  Filled 2024-01-27: qty 40, 10d supply, fill #0

## 2024-01-27 MED ORDER — PREDNISONE 10 MG (21) PO TBPK
ORAL_TABLET | ORAL | 0 refills | Status: AC
  Filled 2024-01-27: qty 21, 6d supply, fill #0

## 2024-01-28 ENCOUNTER — Encounter

## 2024-01-28 ENCOUNTER — Telehealth: Admitting: Physician Assistant

## 2024-01-28 DIAGNOSIS — T63421A Toxic effect of venom of ants, accidental (unintentional), initial encounter: Secondary | ICD-10-CM

## 2024-01-28 NOTE — Progress Notes (Signed)
 E-Visit for Insect Sting  Thank you for describing the insect sting for us .  Here is how we plan to help!  Based on what you have shared with me you have stings that we will treat with a short course of prednisone.  The 2 greatest risks from insect stings are allergic reaction, which can be fatal in some people and infection, which is more common and less serious.  Bees, wasps, yellow jackets, and hornets belong to a class of insects called Hymenoptera.  Most insect stings cause only minor discomfort.  Stings can happen anywhere on the body and can be painful.  Most stings are from honey bees or yellow jackets.  Fire ants can sting multiple times.  The sites of the stings are more likely to become infected.    Based on your information I have:, Provided a home care guide for insect stings and instructions on when to call for help. I see you were prescribed a steroid pack yesterday. Have you started taking this yet?  What can be used to prevent Insect Stings?  Insect repellant with at least 20% DEET.  Wearing long pants and shirts with socks and shoes.  Wear dark or drab-colored clothes rather than bright colors.  Avoid using perfumes and hair sprays; these attract insects.  HOME CARE ADVICE:  1. Stinger removal: The stinger looks like a tiny black dot in the sting. Use a fingernail, credit card edge, or knife-edge to scrape it off.  Don't pull it out because it squeezes out more venom. If the stinger is below the skin surface, leave it alone.  It will be shed with normal skin healing. 2. Use cold compresses to the area of the sting for 10-20 minutes.  You may repeat this as needed to relieve symptoms of pain and swelling. 3.  For pain relief, take acetominophen 650 mg 4-6 hours as needed or ibuprofen 400 mg every 6-8 hours as needed or naproxen 250-500 mg every 12 hours as needed. 4.  You can also use hydrocortisone cream 0.5% or 1% up to 4 times daily as needed for itching. 5.  If the  sting becomes very itchy, take Benadryl  25-50 mg, follow directions on box. 6.  Wash the area 2-3 times daily with antibacterial soap and warm water. 7. Call your Doctor if: Fever, a severe headache, or rash occur in the next 2 weeks. Sting area begins to look infected. Redness and swelling worsens after home treatment. Your current symptoms become worse.    MAKE SURE YOU:  Understand these instructions. Will watch your condition. Will get help right away if you are not doing well or get worse.  Thank you for choosing an e-visit.  Your e-visit answers were reviewed by a board certified advanced clinical practitioner to complete your personal care plan. Depending upon the condition, your plan could have included both over the counter or prescription medications.  Please review your pharmacy choice. Make sure the pharmacy is open so you can pick up prescription now. If there is a problem, you may contact your provider through [employer] and have the prescription routed to another pharmacy.  Your safety is important to us . If you have drug allergies check your prescription carefully.   For the next 24 hours you can use MyChart to ask questions about today's visit, request a non-urgent call back, or ask for a work or school excuse. You will get an email in the next two days asking about your experience. I hope  that your e-visit has been valuable and will speed your recovery.

## 2024-01-28 NOTE — Progress Notes (Signed)
 I have spent 5 minutes in review of e-visit questionnaire, review and updating patient chart, medical decision making and response to patient.   Piedad Climes, PA-C

## 2024-01-29 ENCOUNTER — Other Ambulatory Visit (HOSPITAL_COMMUNITY): Payer: Self-pay

## 2024-01-29 ENCOUNTER — Ambulatory Visit
Admission: RE | Admit: 2024-01-29 | Discharge: 2024-01-29 | Disposition: A | Discharge status: Home / Self Care | Source: Ambulatory Visit | Attending: Family Medicine | Admitting: Family Medicine

## 2024-01-29 VITALS — BP 130/86 | HR 83 | Temp 97.6°F | Resp 16

## 2024-01-29 DIAGNOSIS — L249 Irritant contact dermatitis, unspecified cause: Secondary | ICD-10-CM

## 2024-01-29 HISTORY — DX: Irritable bowel syndrome, unspecified: K58.9

## 2024-01-29 HISTORY — DX: Supraventricular tachycardia, unspecified: I47.10

## 2024-01-29 NOTE — Discharge Instructions (Addendum)
 Start oral prednisone and continue with hydrocortisone cream (for up to 1 week).

## 2024-01-29 NOTE — ED Provider Notes (Signed)
 [facility]  Note:  This document was prepared using Conservation officer, historic buildings and may include unintentional dictation errors.  MRN: [id number] DOB: May 22, 1986  Subjective:   Joshua Harrison is a 38 y.o. male presenting for 1 week history of persistent irritated spots worse over his lower legs.  Symptoms started when he spent some time at the beach.  He has since returned and saw an orthopedist for different problem.  He was prescribed prednisone course and was advised that it could help with his particular rash.  However, his concern is to make sure he is not seeing infection of the bites.  Believes it was sand gnats.  No fever, facial or oral swelling, shortness of breath, nausea, vomiting, abdominal pain, drainage of pus or bleeding.  No current facility-administered medications for this encounter.  Current Outpatient Medications:    COVID-19 At Home Antigen Test [facility] COVID-19 HOME TEST) KIT, Use as directed within package instructions (Patient not taking: Reported on 01/16/2022), Disp: 4 each, Rfl: 0   COVID-19 At Home Antigen Test (CARESTART COVID-19 HOME TEST) KIT, use as directed. (Patient not taking: Reported on 01/16/2022), Disp: 4 each, Rfl: 0   diazepam  (VALIUM ) 10 MG tablet, Take 1-2 tablets by mouth one hour prior to procedure., Disp: 2 tablet, Rfl: 0   Eluxadoline  (VIBERZI ) 100 MG TABS, Take 1 tablet (100 mg total) by mouth daily with food, Disp: 90 tablet, Rfl: 0   Eluxadoline  (VIBERZI ) 100 MG TABS, Take 1 tablet (100 mg total) by mouth daily., Disp: 90 tablet, Rfl: 2   EPINEPHrine  (EPIPEN  2-PAK) 0.3 mg/0.3 mL IJ SOAJ injection, Use as directed., Disp: 2 each, Rfl: 3   methocarbamol (ROBAXIN) 500 MG tablet, Take 1 tablet (500 mg total) by mouth 4 (four) times daily., Disp: 40 tablet, Rfl: 0   metoCLOPramide  (REGLAN ) 10 MG tablet, Take 1 tablet (10 mg total) by mouth every 8 (eight) hours as needed for nausea. (Patient not taking: Reported  on 01/16/2022), Disp: 30 tablet, Rfl: 0   omeprazole  (PRILOSEC) 20 MG capsule, Take 1 capsule (20 mg total) by mouth every morning 30 minutes before morning meal, Disp: 30 capsule, Rfl: 3   predniSONE (STERAPRED UNI-PAK 21 TAB) 10 MG (21) TBPK tablet, Take as directed on package, Disp: 21 tablet, Rfl: 0   traZODone  (DESYREL ) 50 MG tablet, Take 1 tablet (50 mg total) by mouth at bedtime. (Patient not taking: Reported on 01/16/2022), Disp: 30 tablet, Rfl: 5   triamcinolone  ointment (KENALOG ) 0.1 %, Apply 1 application. topically 2 (two) times daily as needed (Rash)., Disp: 60 g, Rfl: 11   Ubrogepant  (UBRELVY ) 50 MG TABS, In case of recurrent migraine take po (Patient not taking: Reported on 01/16/2022), Disp: 30 tablet, Rfl: 0   VIBERZI  100 MG TABS, Take 100 mg by mouth daily., Disp: , Rfl:    No Known Allergies  Past Medical History:  Diagnosis Date   IBS (irritable bowel syndrome)    per pt   SVT (supraventricular tachycardia) (HCC)    per pt     Past Surgical History:  Procedure Laterality Date   HYDROCELE EXCISION / REPAIR      No family history on file.  Social History   Tobacco Use   Smoking status: Never   Smokeless tobacco: Never  Vaping Use   Vaping status: Never Used  Substance Use Topics   Alcohol use: Yes    Comment: Socially   Drug use: No    ROS  Objective:   Vitals: BP 130/86 (BP Location: Right Arm)   Pulse 83   Temp 97.6 F (36.4 C) (Oral)   Resp 16   SpO2 98%   Physical Exam Constitutional:      General: He is not in acute distress.    Appearance: Normal appearance. He is well-developed and normal weight. He is not ill-appearing, toxic-appearing or diaphoretic.  HENT:     Head: Normocephalic and atraumatic.     Right Ear: External ear normal.     Left Ear: External ear normal.     Nose: Nose normal.     Mouth/Throat:     Pharynx: Oropharynx is clear.  Eyes:     General: No scleral icterus.       Right eye: No discharge.        Left eye:  No discharge.     Extraocular Movements: Extraocular movements intact.  Cardiovascular:     Rate and Rhythm: Normal rate.  Pulmonary:     Effort: Pulmonary effort is normal.  Musculoskeletal:     Cervical back: Normal range of motion.  Skin:    Findings: Rash (multiple scattered urticarial type lesions scattered diffusely over the lower legs extending toward the thighs; no tenderness, drainage of pus or bleeding) present.  Neurological:     Mental Status: He is alert and oriented to person, place, and time.  Psychiatric:        Mood and Affect: Mood normal.        Behavior: Behavior normal.        Thought Content: Thought content normal.        Judgment: Judgment normal.     Assessment and Plan :   PDMP not reviewed this encounter.  1. Irritant dermatitis    Lesions appear to be consistent with irritant dermatitis likely from an irritant in the sand.  I do believe he would benefit from the oral prednisone course that was prescribed.  Supplement with hydroxyzine.  No signs of secondary infection.  Counseled patient on potential for adverse effects with medications prescribed/recommended today, ER and return-to-clinic precautions discussed, patient verbalized understanding.    Adolph Hoop, [place] 01/29/24 1533

## 2024-01-29 NOTE — ED Triage Notes (Addendum)
 Pt c/o multiple insect bites x 1 week-his ortho gave him rx for prednisone and Evisit yesterday advised to start prednisone-pt has not started prednisone-NAD-steady gait

## 2024-01-30 DIAGNOSIS — K580 Irritable bowel syndrome with diarrhea: Secondary | ICD-10-CM | POA: Diagnosis not present

## 2024-02-26 ENCOUNTER — Other Ambulatory Visit (HOSPITAL_COMMUNITY): Payer: Self-pay

## 2024-02-27 ENCOUNTER — Other Ambulatory Visit: Payer: Self-pay

## 2024-02-27 ENCOUNTER — Other Ambulatory Visit (HOSPITAL_COMMUNITY): Payer: Self-pay

## 2024-03-02 ENCOUNTER — Other Ambulatory Visit (HOSPITAL_COMMUNITY): Payer: Self-pay

## 2024-03-02 ENCOUNTER — Other Ambulatory Visit: Payer: Self-pay

## 2024-03-03 ENCOUNTER — Other Ambulatory Visit (HOSPITAL_COMMUNITY): Payer: Self-pay

## 2024-04-15 ENCOUNTER — Other Ambulatory Visit (HOSPITAL_COMMUNITY): Payer: Self-pay

## 2024-04-29 DIAGNOSIS — Z0000 Encounter for general adult medical examination without abnormal findings: Secondary | ICD-10-CM | POA: Diagnosis not present

## 2024-04-29 DIAGNOSIS — K580 Irritable bowel syndrome with diarrhea: Secondary | ICD-10-CM | POA: Diagnosis not present

## 2024-04-29 DIAGNOSIS — R12 Heartburn: Secondary | ICD-10-CM | POA: Diagnosis not present

## 2024-04-29 DIAGNOSIS — G43909 Migraine, unspecified, not intractable, without status migrainosus: Secondary | ICD-10-CM | POA: Diagnosis not present

## 2024-06-05 ENCOUNTER — Other Ambulatory Visit (HOSPITAL_COMMUNITY): Payer: Self-pay

## 2024-06-05 MED ORDER — VIBERZI 100 MG PO TABS
100.0000 mg | ORAL_TABLET | Freq: Every day | ORAL | 1 refills | Status: AC
  Filled 2024-06-05: qty 90, 90d supply, fill #0
  Filled 2024-09-09: qty 90, 90d supply, fill #1

## 2024-06-08 ENCOUNTER — Other Ambulatory Visit (HOSPITAL_COMMUNITY): Payer: Self-pay

## 2024-08-24 ENCOUNTER — Telehealth: Admitting: Emergency Medicine

## 2024-08-24 DIAGNOSIS — L0100 Impetigo, unspecified: Secondary | ICD-10-CM | POA: Diagnosis not present

## 2024-08-24 MED ORDER — MUPIROCIN 2 % EX OINT: 1.0000 | TOPICAL_OINTMENT | Freq: Three times a day (TID) | CUTANEOUS | 0 refills | Status: DC

## 2024-08-24 NOTE — Progress Notes (Signed)
 E Visit for Cellulitis  We are sorry that you are not feeling well. Here is how we plan to help!  Based on what you shared, it appears you may have impetigo as you suspect.   Impetigo is a skin infection.   Cellulitis is a bacterial skin infection that typically presents with redness, swelling, warmth, and tenderness in the affected area. Small red spots, minor bleeding under the skin, and fluid-filled blisters may also appear. Fever can sometimes accompany the infection.     I have prescribed:  mupirocin  ointment. Apply to infected skin three times a day for 5 days.   HOME CARE:  Take all medications as prescribed and be sure to complete the full course - even if your skin appears to be improving.   GET HELP RIGHT AWAY IF:  Symptoms do not begin to improve within 48 hours. Severe redness persists or worsens. If the area turns color, spreads or swells. If it blisters and opens, develops yellow-brown crust or bleeds. You develop a fever or chills. If the pain increases or becomes unbearable.  Are unable to keep fluids and food down.  MAKE SURE YOU   Understand these instructions. Will watch your condition. Will get help right away if you are not doing well or get worse.  Thank you for choosing an e-visit.   Your e-visit answers were reviewed by a board certified advanced clinical practitioner to complete your personal care plan. Depending upon the condition, your plan could have included both over the counter or prescription medications.   Please review your pharmacy choice. Make sure the pharmacy is open so you can pick up the prescription now. If there is a problem, you may contact your provider through [employer] and have the prescription routed to another pharmacy.   Your safety is important to us . If you have drug allergies, check your prescription carefully.   For the next 24 hours you can use MyChart to ask questions about today's visit, request a non-urgent call  back, or ask for a work or school excuse.   You will receive an email in the next two days asking about your experience. I hope that your e-visit has been valuable and will speed up your recovery.    I have spent 5 minutes in review of e-visit questionnaire, review and updating patient chart, medical decision making and response to patient.   Jon Belt, PhD, FNP-BC

## 2024-08-26 ENCOUNTER — Telehealth: Admitting: Emergency Medicine

## 2024-08-26 ENCOUNTER — Other Ambulatory Visit (HOSPITAL_COMMUNITY): Payer: Self-pay

## 2024-08-26 DIAGNOSIS — L089 Local infection of the skin and subcutaneous tissue, unspecified: Secondary | ICD-10-CM

## 2024-08-26 MED ORDER — MUPIROCIN 2 % EX OINT
1.0000 | TOPICAL_OINTMENT | Freq: Three times a day (TID) | CUTANEOUS | 0 refills | Status: AC
  Filled 2024-08-26: qty 22, 30d supply, fill #0

## 2024-08-26 NOTE — Progress Notes (Signed)
 E Visit for Cellulitis  We are sorry that you are not feeling well. Here is how we plan to help!  Based on what you shared, it appears you may have small skin infections on your cheek.    I recommend you use antibiotic ointment on your cheek infections, too. If you have sufficient mupirocin , it's ok to use that on your cheek spots. I have sent in a 2nd prescription of it for you in case you might run out of the original prescription.   Wash your face with soapy water before applying the mupirocin  to your cheek  Cellulitis is a bacterial skin infection that typically presents with redness, swelling, warmth, and tenderness in the affected area. Small red spots, minor bleeding under the skin, and fluid-filled blisters may also appear. Fever can sometimes accompany the infection.     HOME CARE:  Take all medications as prescribed and be sure to complete the full course - even if your skin appears to be improving.   GET HELP RIGHT AWAY IF:  Symptoms do not begin to improve within 48 hours. Severe redness persists or worsens. If the area turns color, spreads or swells. If it blisters and opens, develops yellow-brown crust or bleeds. You develop a fever or chills. If the pain increases or becomes unbearable.  Are unable to keep fluids and food down.  MAKE SURE YOU   Understand these instructions. Will watch your condition. Will get help right away if you are not doing well or get worse.  Thank you for choosing an e-visit.   Your e-visit answers were reviewed by a board certified advanced clinical practitioner to complete your personal care plan. Depending upon the condition, your plan could have included both over the counter or prescription medications.   Please review your pharmacy choice. Make sure the pharmacy is open so you can pick up the prescription now. If there is a problem, you may contact your provider through [employer] and have the prescription routed to another  pharmacy.   Your safety is important to us . If you have drug allergies, check your prescription carefully.   For the next 24 hours you can use MyChart to ask questions about today's visit, request a non-urgent call back, or ask for a work or school excuse.   You will receive an email in the next two days asking about your experience. I hope that your e-visit has been valuable and will speed up your recovery.    I have spent 5 minutes in review of e-visit questionnaire, review and updating patient chart, medical decision making and response to patient.   Jon Belt, PhD, FNP-BC

## 2024-09-09 ENCOUNTER — Other Ambulatory Visit (HOSPITAL_COMMUNITY): Payer: Self-pay
